# Patient Record
Sex: Male | Born: 1955 | Race: White | Hispanic: No | Smoking: Never smoker
Health system: Southern US, Community
[De-identification: ages and names within clinical notes are randomized; demographics above are authoritative.]

## PROBLEM LIST (undated history)

## (undated) DIAGNOSIS — N400 Enlarged prostate without lower urinary tract symptoms: Secondary | ICD-10-CM

## (undated) DIAGNOSIS — M1990 Unspecified osteoarthritis, unspecified site: Secondary | ICD-10-CM

## (undated) DIAGNOSIS — J40 Bronchitis, not specified as acute or chronic: Secondary | ICD-10-CM

## (undated) HISTORY — DX: Benign prostatic hyperplasia without lower urinary tract symptoms: N40.0

## (undated) HISTORY — PX: WRIST SURGERY: SHX841

## (undated) HISTORY — PX: HERNIA REPAIR: SHX51

## (undated) HISTORY — PX: CIRCUMCISION: SUR203

## (undated) HISTORY — PX: HYDROCELE EXCISION: SHX482

## (undated) HISTORY — PX: LEG SURGERY: SHX1003

---

## 2000-06-01 ENCOUNTER — Encounter: Payer: Self-pay | Admitting: Specialist

## 2000-06-01 ENCOUNTER — Encounter: Admission: RE | Admit: 2000-06-01 | Discharge: 2000-06-01 | Payer: Self-pay | Admitting: Specialist

## 2000-06-05 ENCOUNTER — Ambulatory Visit (HOSPITAL_BASED_OUTPATIENT_CLINIC_OR_DEPARTMENT_OTHER): Admission: RE | Admit: 2000-06-05 | Discharge: 2000-06-05 | Payer: Self-pay | Admitting: Specialist

## 2005-01-27 ENCOUNTER — Other Ambulatory Visit: Admission: RE | Admit: 2005-01-27 | Discharge: 2005-01-27 | Payer: Self-pay | Admitting: Unknown Physician Specialty

## 2005-06-04 ENCOUNTER — Emergency Department (HOSPITAL_COMMUNITY): Admission: EM | Admit: 2005-06-04 | Discharge: 2005-06-04 | Payer: Self-pay | Admitting: Emergency Medicine

## 2016-08-23 ENCOUNTER — Ambulatory Visit (INDEPENDENT_AMBULATORY_CARE_PROVIDER_SITE_OTHER): Payer: BLUE CROSS/BLUE SHIELD | Admitting: Urology

## 2016-08-23 DIAGNOSIS — R972 Elevated prostate specific antigen [PSA]: Secondary | ICD-10-CM

## 2016-08-23 DIAGNOSIS — N5201 Erectile dysfunction due to arterial insufficiency: Secondary | ICD-10-CM

## 2016-08-23 DIAGNOSIS — N401 Enlarged prostate with lower urinary tract symptoms: Secondary | ICD-10-CM

## 2016-08-23 DIAGNOSIS — N200 Calculus of kidney: Secondary | ICD-10-CM

## 2016-09-02 ENCOUNTER — Other Ambulatory Visit: Payer: Self-pay | Admitting: Urology

## 2016-09-02 DIAGNOSIS — R972 Elevated prostate specific antigen [PSA]: Secondary | ICD-10-CM

## 2016-10-04 ENCOUNTER — Ambulatory Visit (HOSPITAL_COMMUNITY)
Admission: RE | Admit: 2016-10-04 | Discharge: 2016-10-04 | Disposition: A | Discharge status: Home / Self Care | Payer: BLUE CROSS/BLUE SHIELD | Source: Ambulatory Visit | Attending: Urology | Admitting: Urology

## 2016-10-04 DIAGNOSIS — R972 Elevated prostate specific antigen [PSA]: Secondary | ICD-10-CM | POA: Diagnosis not present

## 2016-10-04 MED ORDER — GENTAMICIN SULFATE 40 MG/ML IJ SOLN
INTRAMUSCULAR | Status: AC
  Filled 2016-10-04: qty 4

## 2016-10-04 MED ORDER — GENTAMICIN SULFATE 40 MG/ML IJ SOLN
160.0000 mg | Freq: Once | INTRAMUSCULAR | Status: AC
  Administered 2016-10-04: 160 mg via INTRAMUSCULAR

## 2016-10-04 MED ORDER — LIDOCAINE HCL (PF) 2 % IJ SOLN
10.0000 mL | Freq: Once | INTRAMUSCULAR | Status: AC
  Administered 2016-10-04: 10 mL

## 2016-10-04 MED ORDER — LIDOCAINE HCL (PF) 2 % IJ SOLN
INTRAMUSCULAR | Status: AC
  Administered 2016-10-04: 10 mL
  Filled 2016-10-04: qty 10

## 2016-10-04 NOTE — Discharge Instructions (Signed)
Transrectal Ultrasound-Guided Biopsy °A transrectal ultrasound-guided biopsy is a procedure to remove samples of tissue from your prostate using ultrasound images to guide the procedure. The procedure is usually done to evaluate the prostate gland of men who have an elevated prostate-specific antigen (PSA). PSA is a blood test to screen for prostate cancer. The biopsy samples are taken to check for prostate cancer. °Tell a health care provider about: °· Any allergies you have. °· All medicines you are taking, including vitamins, herbs, eye drops, creams, and over-the-counter medicines. °· Any problems you or family members have had with anesthetic medicines. °· Any blood disorders you have. °· Any surgeries you have had. °· Any medical conditions you have. °What are the risks? °Generally, this is a safe procedure. However, as with any procedure, problems can occur. Possible problems include: °· Infection of your prostate. °· Bleeding from your rectum or blood in your urine. °· Difficulty urinating. °· Nerve damage (this is usually temporary). °· Damage to surrounding structures such as blood vessels, organs, and muscles, which would require other procedures. °What happens before the procedure? °· Do not eat or drink anything after midnight on the night before the procedure or as directed by your health care provider. °· Take medicines only as directed by your health care provider. °· Your health care provider may have you stop taking certain medicines 5-7 days before the procedure. °· You will be given an enema before the procedure. During an enema, a liquid is injected into your rectum to clear out waste. °· You may have lab tests the day of your procedure. °· Plan to have someone take you home after the procedure. °What happens during the procedure? °· You will be given medicine to help you relax (sedative) before the procedure. An IV tube will be inserted into one of your veins and used to give fluids and  medicine. °· You will be given antibiotic medicine to reduce the risk of an infection. °· You will be placed on your side for the procedure. °· A probe with lubricated gel will be placed into your rectum, and images will be taken of your prostate and surrounding structures. °· Numbing medicine will be injected into the prostate before the biopsy samples are taken. °· A biopsy needle will then be inserted and guided to your prostate with the use of the ultrasound images. °· Samples of prostate tissue will be taken, and the needle will then be removed. °· The biopsy samples will be sent to a lab to be analyzed. Results are usually back in 2-3 days. °What happens after the procedure? °· You will be taken to a recovery area where you will be monitored. °· You may have some discomfort in the rectal area. You will be given pain medicines to control this. °· You may be allowed to go home the same day, or you may need to stay in the hospital overnight. °This information is not intended to replace advice given to you by your health care provider. Make sure you discuss any questions you have with your health care provider. °Document Released: 11/25/2013 Document Revised: 12/17/2015 Document Reviewed: 02/27/2013 °Elsevier Interactive Patient Education © 2017 [employer] ° °

## 2017-02-21 ENCOUNTER — Ambulatory Visit: Payer: BLUE CROSS/BLUE SHIELD | Admitting: Urology

## 2018-08-28 ENCOUNTER — Other Ambulatory Visit (HOSPITAL_COMMUNITY)
Admission: RE | Admit: 2018-08-28 | Discharge: 2018-08-28 | Disposition: A | Discharge status: Home / Self Care | Payer: BLUE CROSS/BLUE SHIELD | Source: Ambulatory Visit | Attending: Urology | Admitting: Urology

## 2018-08-28 ENCOUNTER — Ambulatory Visit (INDEPENDENT_AMBULATORY_CARE_PROVIDER_SITE_OTHER): Payer: BLUE CROSS/BLUE SHIELD | Admitting: Urology

## 2018-08-28 DIAGNOSIS — R350 Frequency of micturition: Secondary | ICD-10-CM

## 2018-08-28 DIAGNOSIS — R972 Elevated prostate specific antigen [PSA]: Secondary | ICD-10-CM | POA: Diagnosis not present

## 2018-08-28 DIAGNOSIS — N401 Enlarged prostate with lower urinary tract symptoms: Secondary | ICD-10-CM | POA: Insufficient documentation

## 2018-08-30 LAB — URINE CULTURE: Culture: NO GROWTH

## 2018-10-30 ENCOUNTER — Ambulatory Visit (INDEPENDENT_AMBULATORY_CARE_PROVIDER_SITE_OTHER): Payer: BLUE CROSS/BLUE SHIELD | Admitting: Urology

## 2018-10-30 DIAGNOSIS — R350 Frequency of micturition: Secondary | ICD-10-CM | POA: Diagnosis not present

## 2018-10-30 DIAGNOSIS — R972 Elevated prostate specific antigen [PSA]: Secondary | ICD-10-CM | POA: Diagnosis not present

## 2018-10-30 DIAGNOSIS — N401 Enlarged prostate with lower urinary tract symptoms: Secondary | ICD-10-CM | POA: Diagnosis not present

## 2019-07-24 ENCOUNTER — Telehealth: Payer: Self-pay | Admitting: Urology

## 2019-07-24 NOTE — Telephone Encounter (Signed)
Pt called, wanted to know if he could get a refill of tamsulosin.

## 2019-07-24 NOTE — Telephone Encounter (Signed)
Called pt.no answer. No way to leave message. Rx was sent on 12/29 via urochart from [facility].

## 2019-07-25 NOTE — Telephone Encounter (Signed)
Did you find out of the rx was at the pharmacy for the patient when he called back yesterday? I called pt again and no answer.

## 2019-10-24 ENCOUNTER — Telehealth: Payer: Self-pay | Admitting: Urology

## 2019-10-24 NOTE — Telephone Encounter (Signed)
Pt called and asked for a refill on his tamsulosin.

## 2019-10-28 ENCOUNTER — Other Ambulatory Visit: Payer: Self-pay

## 2019-10-28 DIAGNOSIS — N401 Enlarged prostate with lower urinary tract symptoms: Secondary | ICD-10-CM

## 2019-10-28 MED ORDER — TAMSULOSIN HCL 0.4 MG PO CAPS: 0.4000 mg | ORAL_CAPSULE | Freq: Every day | ORAL | 0 refills | Status: DC

## 2019-10-28 NOTE — Telephone Encounter (Signed)
Rx Sent  

## 2019-11-19 ENCOUNTER — Encounter: Payer: Self-pay | Admitting: Urology

## 2019-11-19 ENCOUNTER — Ambulatory Visit (INDEPENDENT_AMBULATORY_CARE_PROVIDER_SITE_OTHER): Payer: BC Managed Care – PPO | Admitting: Urology

## 2019-11-19 ENCOUNTER — Other Ambulatory Visit: Payer: Self-pay | Admitting: Urology

## 2019-11-19 ENCOUNTER — Other Ambulatory Visit: Payer: Self-pay

## 2019-11-19 VITALS — BP 130/85 | HR 85 | Temp 98.7°F | Ht 68.0 in | Wt 150.0 lb

## 2019-11-19 DIAGNOSIS — N401 Enlarged prostate with lower urinary tract symptoms: Secondary | ICD-10-CM | POA: Diagnosis not present

## 2019-11-19 DIAGNOSIS — N521 Erectile dysfunction due to diseases classified elsewhere: Secondary | ICD-10-CM

## 2019-11-19 LAB — POCT URINALYSIS DIPSTICK
Bilirubin, UA: NEGATIVE
Glucose, UA: NEGATIVE
Ketones, UA: NEGATIVE
Nitrite, UA: NEGATIVE
Protein, UA: NEGATIVE
Spec Grav, UA: 1.025 (ref 1.010–1.025)
Urobilinogen, UA: NEGATIVE E.U./dL — AB
pH, UA: 6 (ref 5.0–8.0)

## 2019-11-19 MED ORDER — SILDENAFIL CITRATE 100 MG PO TABS: 100.0000 mg | ORAL_TABLET | Freq: Every day | ORAL | 11 refills | Status: DC | PRN

## 2019-11-19 MED ORDER — TAMSULOSIN HCL 0.4 MG PO CAPS: 0.4000 mg | ORAL_CAPSULE | Freq: Every day | ORAL | 11 refills | Status: DC

## 2019-11-19 NOTE — Progress Notes (Signed)
H&P  Chief Complaint: Elevated PSA and BPH  History of Present Illness:   4.27.2021: Here today for follow-up. No recent PSA. Of note, he had a 2 night hospitalization after a complicated, aborted colonoscopy for acute pulmonary edema . He reports having had good days and bad days with his intermittent urinary sx's. He first made this appointment over concerns w/ lower pelvic pain he first thought could be related to his prostate but now suspects this is just related to his colon. He continues on tamsulosin.    He also expresses concerns over recently getting involved with an online doctor based out of [place] who prescribed him viagra -- he is trying to cease contact with him and urged [place] to not coordinate with him ("Dr Jeralene Peters").  IPSS Questionnaire (AUA-7): Over the past month.   1)  How often have you had a sensation of not emptying your bladder completely after you finish urinating?  1 - Less than 1 time in 5  2)  How often have you had to urinate again less than two hours after you finished urinating? 2 - Less than half the time  3)  How often have you found you stopped and started again several times when you urinated?  0 - Not at all  4) How difficult have you found it to postpone urination?  5 - Almost always  5) How often have you had a weak urinary stream?  0 - Not at all  6) How often have you had to push or strain to begin urination?  0 - Not at all  7) How many times did you most typically get up to urinate from the time you went to bed until the time you got up in the morning?  3 - 3 times  Total score:  0-7 mildly symptomatic   8-19 moderately symptomatic   20-35 severely symptomatic   Total: 11 QoL: 3  (below copied from [facility] records):  PSA: Eric Andrade is a 64 year-old male established patient who is here for follow up for further evaluation of his elevated PSA.  His last PSA was performed 08/23/2016. The last PSA value was 8.2.   Here for followup of elevated PSA. He  had a previous ultrasound and biopsy by Dr. Exie Parody in [place] 8.20.2015 At that time, PSA was 5.3.   Repeat TRUS/Bx 3.13.2018. Prostatic volume 59 ml PSA 8.2 PSAD 0.14. All 12 cores benign.   2.4.2020: He has not returned since that biopsy. Overall, he is voiding well the comes in today because of dysuria and slower stream.   4.7.2020: He is here today for follow-up with voiding symptoms. He was put on tamsulosin with excellent response symptom wise. He has not had PSA recheck.   BPH:  I have symptoms of an enlarged prostate.  2.4.2020: Urinary symptoms are worse recently. He has had recent dysuria, frequency, urgency and feeling of incomplete emptying.   IPSS 33  Quality of Life score 6  He was started on tamsulosin   4.7.2020: Symptoms are better on tamsulosin. IPSS improved.    No past medical history on file.   Home Medications:  Allergies as of 11/19/2019   No Known Allergies     Medication List       Accurate as of November 19, 2019 11:31 AM. If you have any questions, ask your nurse or doctor.        tamsulosin 0.4 MG Caps capsule Commonly known as: FLOMAX What changed: Another medication with  the same name was added. Make sure you understand how and when to take each. Changed by: Jorja Loa, MD   tamsulosin 0.4 MG Caps capsule Commonly known as: FLOMAX Take 1 capsule (0.4 mg total) by mouth daily. What changed: Another medication with the same name was added. Make sure you understand how and when to take each. Changed by: Jorja Loa, MD   tamsulosin 0.4 MG Caps capsule Commonly known as: FLOMAX Take 1 capsule (0.4 mg total) by mouth daily after supper. What changed: You were already taking a medication with the same name, and this prescription was added. Make sure you understand how and when to take each. Changed by: Jorja Loa, MD       Allergies: No Known Allergies  No family history on file.  Social History:  reports that he has  never smoked. He has never used smokeless tobacco. No history on file for alcohol and drug.  ROS: Urological Symptom Review  Patient is experiencing the following symptoms: Hard to postpone urination Get up at night to urinate Leakage of urine Erection problems (male only)   Review of Systems  Gastrointestinal (upper)  : Indigestion/heartburn Gastrointestinal (lower) : Negative for lower GI symptoms Constitutional : Negative for symptoms Skin: Negative for skin symptoms Eyes: Negative for eye symptoms Ear/Nose/Throat : Negative for Ear/Nose/Throat symptoms Hematologic/Lymphatic: Negative for Hematologic/Lymphatic symptoms Cardiovascular : Negative for cardiovascular symptoms Respiratory : Negative for respiratory symptoms Endocrine: Negative for endocrine symptoms Musculoskeletal: Negative for musculoskeletal symptoms Neurological: Negative for neurological symptoms Psychologic: Negative for psychiatric symptoms  Physical Exam:  Vital signs in last 24 hours: BP 130/85   Pulse 85   Temp 98.7 F (37.1 C)   Ht 5\' 8"  (1.727 m)   Wt 150 lb (68 kg)   BMI 22.81 kg/m  Constitutional:  Alert and oriented, No acute distress Cardiovascular: Regular rate  Respiratory: Normal respiratory effort GI: Abdomen is soft, nontender, nondistended, no abdominal masses. No CVAT. No hernias. Genitourinary: Normal male phallus, testes are descended bilaterally and non-tender and without masses, scrotum is normal in appearance without lesions or masses, perineum is normal on inspection. Prostate feels around 100 grams in size, R lobe > L. Lymphatic: No lymphadenopathy Neurologic: Grossly intact, no focal deficits Psychiatric: Normal mood and affect  Laboratory Data:  No results for input(s): WBC, HGB, HCT, PLT in the last 72 hours.  No results for input(s): NA, K, CL, GLUCOSE, BUN, CALCIUM, CREATININE in the last 72 hours.  Invalid input(s): CO3   Results for orders placed  or performed in visit on 11/19/19 (from the past 24 hour(s))  POCT urinalysis dipstick     Status: Abnormal   Collection Time: 11/19/19 11:18 AM  Result Value Ref Range   Color, UA yellow    Clarity, UA clear    Glucose, UA Negative Negative   Bilirubin, UA neg    Ketones, UA neg    Spec Grav, UA 1.025 1.010 - 1.025   Blood, UA trace    pH, UA 6.0 5.0 - 8.0   Protein, UA Negative Negative   Urobilinogen, UA negative (A) 0.2 or 1.0 E.U./dL   Nitrite, UA neg    Leukocytes, UA Moderate (2+) (A) Negative   Appearance clear    Odor     I have reviewed prior pt notes  I have reviewed notes from referring/previous physicians  I have reviewed urinalysis results  I have reviewed prior PSA results   Impression/Assessment:  He seems to  be doing well on tamsulosin with regards to his urinary sx's w/ minimal side effects. He is fine to continue on this. DRE normal. We will get a PSA today. I am fine managing his sildenafil rx (start on 100 mg since he was taking 4-5 20 mg at a time) .  His lower pelvic/abdominal pain more than likely related to this colon issues he has been having that are being evaluated per his other doctors   Plan:  1. PSA today -- will forward results  2. Rx given for 100 mg sildenafil , flomax refilled  3. Return for OV in 1 yr w/ PSA

## 2019-11-19 NOTE — Progress Notes (Signed)
See H& P.

## 2019-11-20 ENCOUNTER — Telehealth: Payer: Self-pay | Admitting: Urology

## 2019-11-20 ENCOUNTER — Other Ambulatory Visit: Payer: Self-pay | Admitting: Urology

## 2019-11-20 DIAGNOSIS — R972 Elevated prostate specific antigen [PSA]: Secondary | ICD-10-CM

## 2019-11-20 LAB — PSA: PSA: 32.8 ng/mL — ABNORMAL HIGH (ref <=4.0)

## 2019-11-20 NOTE — Telephone Encounter (Signed)
Carly from [facility] called and stated she had questions about the pt rx.

## 2019-11-21 NOTE — Telephone Encounter (Signed)
Called pt in regard to [facility] message. Pt. states that he does not want to use [facility] for anymore of his medication needs and that he is satisfied with where  Dr. Diona Fanti sent his rx.

## 2019-11-25 ENCOUNTER — Telehealth: Payer: Self-pay

## 2019-11-25 NOTE — Telephone Encounter (Signed)
-----   Message from Dorisann Frames, RN sent at 11/25/2019 10:57 AM EDT -----  ----- Message ----- From: Franchot Gallo, MD Sent: 11/20/2019  12:57 PM EDT To: Dorisann Frames, RN  Notify patient -PSA up to 32.8.  I would suggest we perform an MRI of his prostate to check to see if he has any suspicious looking areas that would need to be biopsy.  I put order in. ----- Message ----- From: Dorisann Frames, RN Sent: 11/20/2019  12:01 PM EDT To: Franchot Gallo, MD  Please review

## 2019-11-25 NOTE — Telephone Encounter (Signed)
Left message for pt to return call.

## 2019-11-26 NOTE — Telephone Encounter (Signed)
Left message for pt to return call.

## 2019-11-26 NOTE — Telephone Encounter (Signed)
Pt returned call and made aware 

## 2019-11-28 DIAGNOSIS — D120 Benign neoplasm of cecum: Secondary | ICD-10-CM | POA: Insufficient documentation

## 2019-11-28 DIAGNOSIS — Z800 Family history of malignant neoplasm of digestive organs: Secondary | ICD-10-CM | POA: Insufficient documentation

## 2019-11-28 DIAGNOSIS — D124 Benign neoplasm of descending colon: Secondary | ICD-10-CM | POA: Insufficient documentation

## 2019-12-18 ENCOUNTER — Other Ambulatory Visit: Payer: Self-pay

## 2019-12-18 ENCOUNTER — Ambulatory Visit (HOSPITAL_COMMUNITY)
Admission: RE | Admit: 2019-12-18 | Discharge: 2019-12-18 | Disposition: A | Discharge status: Home / Self Care | Payer: BC Managed Care – PPO | Source: Ambulatory Visit | Attending: Urology | Admitting: Urology

## 2019-12-18 DIAGNOSIS — R972 Elevated prostate specific antigen [PSA]: Secondary | ICD-10-CM | POA: Insufficient documentation

## 2019-12-18 MED ORDER — GADOBUTROL 1 MMOL/ML IV SOLN
6.0000 mL | Freq: Once | INTRAVENOUS | Status: AC | PRN
  Administered 2019-12-18: 6 mL via INTRAVENOUS

## 2019-12-31 ENCOUNTER — Other Ambulatory Visit: Payer: Self-pay | Admitting: Urology

## 2019-12-31 DIAGNOSIS — R972 Elevated prostate specific antigen [PSA]: Secondary | ICD-10-CM

## 2019-12-31 NOTE — Progress Notes (Signed)
Attempted to call pt on both numbers listed. No VM and box full.

## 2020-01-01 ENCOUNTER — Telehealth: Payer: Self-pay

## 2020-01-01 NOTE — Telephone Encounter (Signed)
Pt. Was notified MRI was results were good and that a 6 month appt was made with PSA to be done week before. Dr. Diona Fanti ordered this through Staff message.

## 2020-01-06 ENCOUNTER — Telehealth: Payer: Self-pay

## 2020-01-06 ENCOUNTER — Other Ambulatory Visit: Payer: Self-pay

## 2020-01-06 NOTE — Telephone Encounter (Signed)
Pt. Notified. Faxed order for PSA to [facility]. Mail ed copy to pt. Also.

## 2020-01-06 NOTE — Telephone Encounter (Signed)
-----   Message from Franchot Gallo, MD sent at 12/31/2019  9:11 AM EDT ----- Notify pt--good news MRI did not show evidence of significant PCa, but I would like him to have repeat PSA in 1 mo--I put order in . ----- Message ----- From: Dorisann Frames, RN Sent: 12/18/2019   3:11 PM EDT To: Franchot Gallo, MD  Please review

## 2020-03-06 ENCOUNTER — Encounter (INDEPENDENT_AMBULATORY_CARE_PROVIDER_SITE_OTHER): Payer: Self-pay | Admitting: *Deleted

## 2020-06-24 IMAGING — MR MR PROSTATE WO/W CM
12 series · 48 of 48 positions shown · IV contrast (gadavist)
Comparison: 08/03/2017 abdominopelvic CT.

CLINICAL DATA: Elevated PSA.  Prior negative biopsy in 0713.

EXAM:
MR PROSTATE WITHOUT AND WITH CONTRAST
TECHNIQUE: Multiplanar multisequence MRI images were obtained of the pelvis
centered about the prostate. Pre and post contrast images were
obtained.
CONTRAST:  6mL GADAVIST GADOBUTROL 1 MMOL/ML IV SOLN

[Series 3: T1 · axial · 6.0mm · 0.86mm/px · 1 of 43 slices shown (1 of 2)]
[im 1/43]
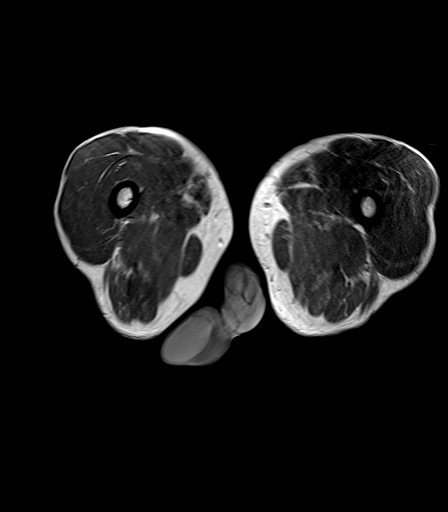

[Series 4: axial whole pelvis · axial · 6.0mm · 0.84mm/px · 1 of 47 slices shown]
[im 1/47]
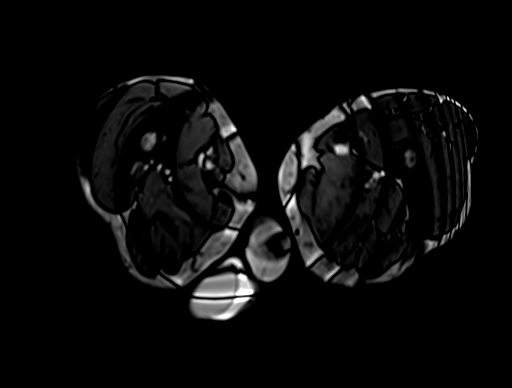

[Series 5: T2 · axial · 3.0mm · 0.47mm/px · 1 of 30 slices shown (1 of 4)]
[im 1/30]
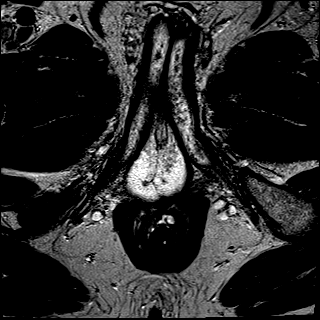

[Series 6: T2 · coronal · 4.0mm · 0.47mm/px · 1 of 24 slices shown (2 of 4)]
[im 1/24]
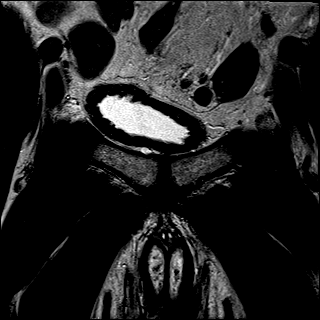

[Series 7: T2 · sagittal · 3.5mm · 0.47mm/px · 1 of 26 slices shown (3 of 4)]
[im 1/26]
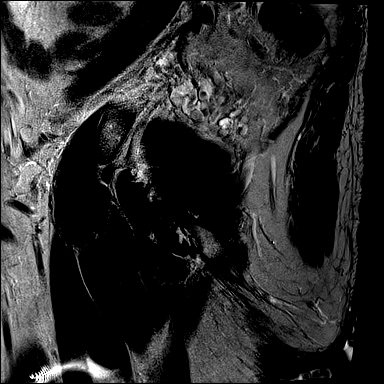

[Series 8: T1 · axial · 3.0mm · 0.50mm/px · 1 of 30 slices shown (2 of 2)]
[im 1/30]
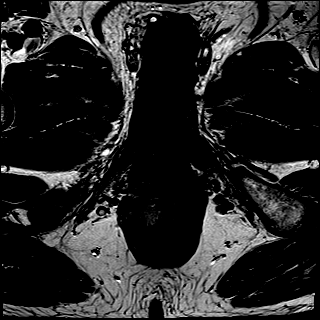

[Series 9: T2 · axial · 1.2mm · 1.00mm/px · z∈[-75,+20]mm · 2 of 80 slices shown (4 of 4)]
[im 1/80]
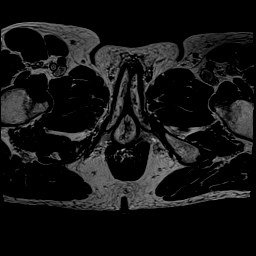
[im 80/80]
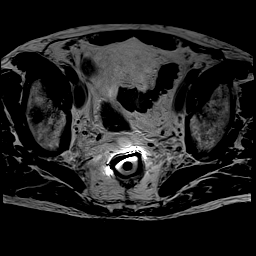

[Series 10: ep2d_diff_b50_400_800_tra_endo**_tracew_dfc_mix · axial · 3.0mm · 1.60mm/px · z∈[-83,+18]mm · 2 of 72 slices shown]
[im 1/72]
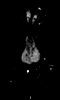
[im 72/72]
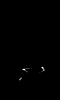

[Series 11: ep2d_diff_b50_400_800_tra_endo**_adc_dfc_mix · axial · 3.0mm · 1.60mm/px · 1 of 30 slices shown]
[im 1/30]
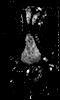

[Series 12: ep2d_diff_b50_400_800_tra_endo**_calc_bval_dfc_mix · axial · 3.0mm · 1.60mm/px · 1 of 30 slices shown]
[im 1/30]
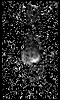

[Series 13: t1_vibe_tra_dyn · axial · 3.0mm · 0.98mm/px · z∈[-79,+13]mm · 19 of 640 slices shown]
[im 1/640]
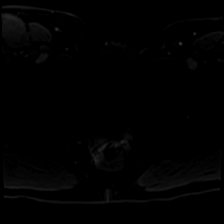
[im 36/640]
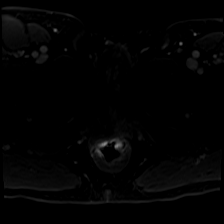
[im 72/640]
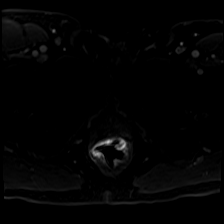
[im 107/640]
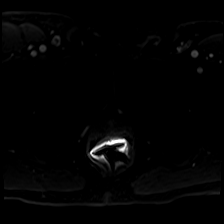
[im 143/640]
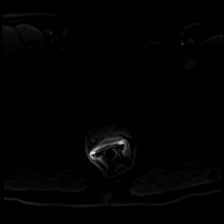
[im 178/640]
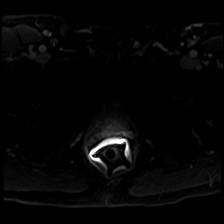
[im 214/640]
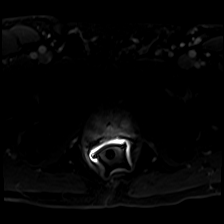
[im 249/640]
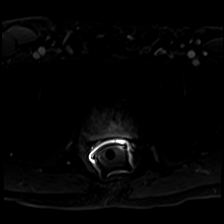
[im 285/640]
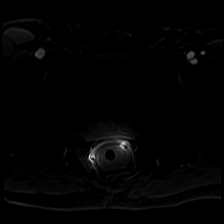
[im 320/640]
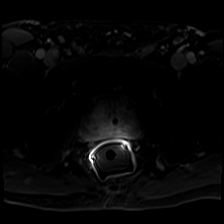
[im 356/640]
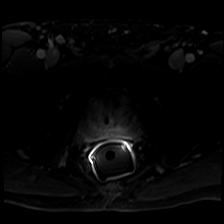
[im 391/640]
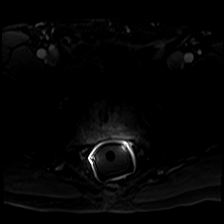
[im 427/640]
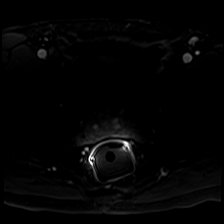
[im 462/640]
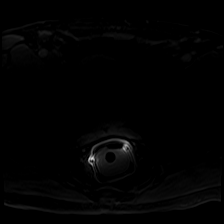
[im 498/640]
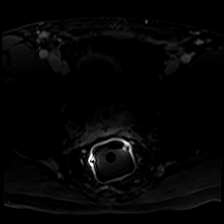
[im 533/640]
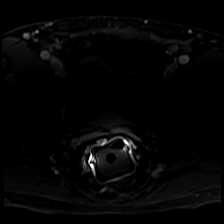
[im 569/640]
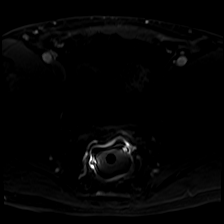
[im 604/640]
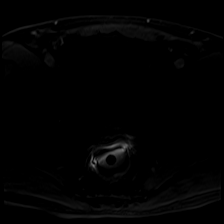
[im 640/640]
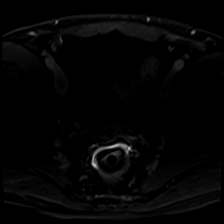

[Series 14: t1_vibe_tra_dyn_sub · axial · 3.0mm · 0.98mm/px · z∈[-79,+13]mm · 17 of 585 slices shown]
[im 1/585]
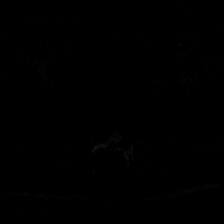
[im 37/585]
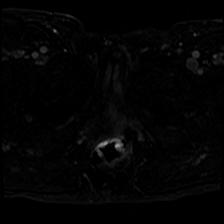
[im 74/585]
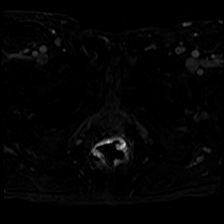
[im 110/585]
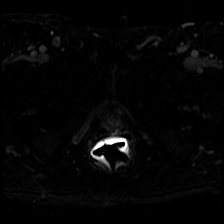
[im 147/585]
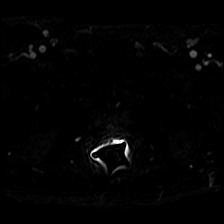
[im 183/585]
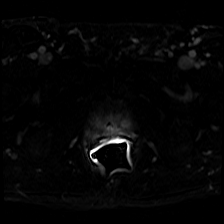
[im 220/585]
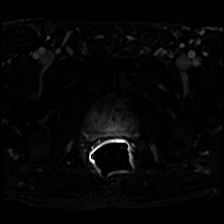
[im 256/585]
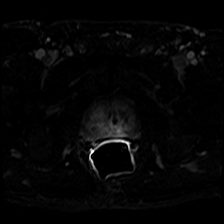
[im 293/585]
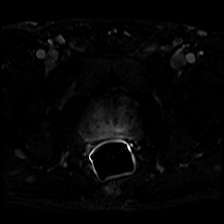
[im 329/585]
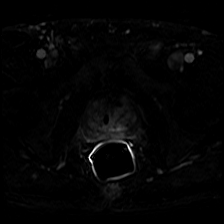
[im 366/585]
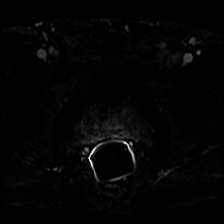
[im 402/585]
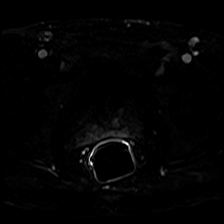
[im 439/585]
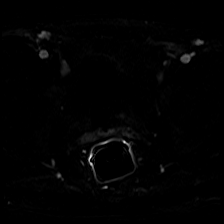
[im 475/585]
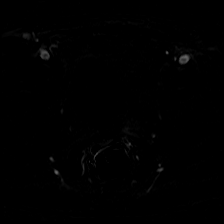
[im 512/585]
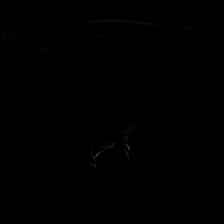
[im 548/585]
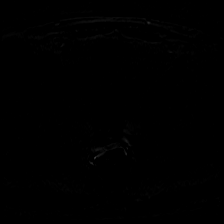
[im 585/585]
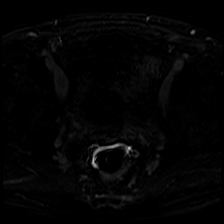

[48 of 48 positions shown; findings below may reference images not displayed]

FINDINGS: Prostate: Moderate central gland enlargement and heterogeneity,
consistent with benign prostatic hyperplasia. No dominant central
gland nodule. Mild to moderate peripheral zone compression. No areas
of masslike T2 hypointensity, restricted diffusion, or early
post-contrast enhancement

Volume: 5.9 x 4.1 x 6.1 cm (volume = 77 cm^3) .

Transcapsular spread:  Absent

Seminal vesicle involvement: Absent

Neurovascular bundle involvement: Absent

Pelvic adenopathy: Absent

Bone metastasis: Absent

Other findings: No significant free fluid.  Normal urinary bladder.
IMPRESSION: No evidence of macroscopic or high-grade prostate carcinoma.
Moderate benign prostatic hyperplasia.

## 2020-07-07 ENCOUNTER — Ambulatory Visit: Payer: BC Managed Care – PPO | Admitting: Urology

## 2020-07-07 NOTE — Progress Notes (Incomplete)
H&P  Chief Complaint: BPH & Elevated PSA  History of Present Illness:   12.14.2021:   (below copied from [facility] records):  PSA: Eric Andrade is a 64 year-old male established patient who is here for follow up for further evaluation of his elevated PSA.  His last PSA was performed 08/23/2016. The last PSA value was 8.2.   Here for followup of elevated PSA. He had a previous ultrasound and biopsy by Dr. Exie Parody in [place] 8.20.2015 At that time, PSA was 5.3.   Repeat TRUS/Bx 3.13.2018. Prostatic volume 59 ml PSA 8.2 PSAD 0.14. All 12 cores benign.   2.4.2020: He has not returned since that biopsy. Overall, he is voiding well the comes in today because of dysuria and slower stream.   4.7.2020: He is here today for follow-up with voiding symptoms. He was put on tamsulosin with excellent response symptom wise. He has not had PSA recheck.   BPH:  I have symptoms of an enlarged prostate.  2.4.2020: Urinary symptoms are worse recently. He has had recent dysuria, frequency, urgency and feeling of incomplete emptying.   IPSS 33  Quality of Life score 6  He was started on tamsulosin   4.7.2020: Symptoms are better on tamsulosin. IPSS improved.     4.27.2021: Here today for follow-up. No recent PSA. Of note, he had a 2 night hospitalization after a complicated, aborted colonoscopy for acute pulmonary edema . He reports having had good days and bad days with his intermittent urinary sx's. He first made this appointment over concerns w/ lower pelvic pain he first thought could be related to his prostate but now suspects this is just related to his colon. He continues on tamsulosin.    He also expresses concerns over recently getting involved with an online doctor based out of [place] who prescribed him viagra -- he is trying to cease contact with him and urged [place] to not coordinate with him ("Dr Jeralene Peters").  Home Medications:  Allergies as of 07/07/2020   No Known Allergies      Medication List       Accurate as of July 07, 2020  9:21 AM. If you have any questions, ask your nurse or doctor.        sildenafil 100 MG tablet Commonly known as: VIAGRA Take 1 tablet (100 mg total) by mouth daily as needed for erectile dysfunction.   tamsulosin 0.4 MG Caps capsule Commonly known as: FLOMAX   tamsulosin 0.4 MG Caps capsule Commonly known as: FLOMAX Take 1 capsule (0.4 mg total) by mouth daily.   tamsulosin 0.4 MG Caps capsule Commonly known as: FLOMAX Take 1 capsule (0.4 mg total) by mouth daily after supper.       Allergies: No Known Allergies  No family history on file.  Social History:  reports that he has never smoked. He has never used smokeless tobacco. No history on file for alcohol use and drug use.  ROS: A complete review of systems was performed.  All systems are negative except for pertinent findings as noted.  Physical Exam:  Vital signs in last 24 hours: There were no vitals taken for this visit. Constitutional:  Alert and oriented, No acute distress Cardiovascular: Regular rate  Respiratory: Normal respiratory effort GI: Abdomen is soft, nontender, nondistended, no abdominal masses. No CVAT.  Genitourinary: Normal male phallus, testes are descended bilaterally and non-tender and without masses, scrotum is normal in appearance without lesions or masses, perineum is normal on inspection. Lymphatic: No lymphadenopathy Neurologic: Grossly  intact, no focal deficits Psychiatric: Normal mood and affect  Laboratory Data:  No results for input(s): WBC, HGB, HCT, PLT in the last 72 hours.  No results for input(s): NA, K, CL, GLUCOSE, BUN, CALCIUM, CREATININE in the last 72 hours.  Invalid input(s): CO3   No results found for this or any previous visit (from the past 24 hour(s)). No results found for this or any previous visit (from the past 240 hour(s)).  Renal Function: No results for input(s): CREATININE in the last 168  hours. CrCl cannot be calculated (No successful lab value found.).  Radiologic Imaging: No results found.  Impression/Assessment:  ***  Plan:  ***

## 2020-11-16 NOTE — Progress Notes (Signed)
History of Present Illness:   Here for followup of elevated PSA. He had a previous TRUS/Bx  by Dr. Exie Parody in [place] 8.20.2015 At that time, PSA was 5.3.   Repeat TRUS/Bx 3.13.2018. Prostatic volume 59 ml PSA 8.2 PSAD 0.14. All 12 cores benign.   2.4.2020: He has not returned since that biopsy. Overall, he is voiding well the comes in today because of dysuria and slower stream.   4.26.2022: For his annual check.  His PSA at his visit a year ago was 32.8.  Apparently he had a recheck and it was down to 10 at his PCPs office and evening.  BPH:   No significant urinary issues that he is complaining about.  He is on tamsulosin.  ED: He does have ED, he would like a prescription for Viagra.   No past medical history on file.  No past surgical history on file.  Home Medications:  Allergies as of 11/17/2020   No Known Allergies     Medication List       Accurate as of November 16, 2020  8:13 PM. If you have any questions, ask your nurse or doctor.        sildenafil 100 MG tablet Commonly known as: VIAGRA Take 1 tablet (100 mg total) by mouth daily as needed for erectile dysfunction.   tamsulosin 0.4 MG Caps capsule Commonly known as: FLOMAX   tamsulosin 0.4 MG Caps capsule Commonly known as: FLOMAX Take 1 capsule (0.4 mg total) by mouth daily.   tamsulosin 0.4 MG Caps capsule Commonly known as: FLOMAX Take 1 capsule (0.4 mg total) by mouth daily after supper.       Allergies: No Known Allergies  No family history on file.  Social History:  reports that he has never smoked. He has never used smokeless tobacco. No history on file for alcohol use and drug use.  ROS: A complete review of systems was performed.  All systems are negative except for pertinent findings as noted.  Physical Exam:  Vital signs in last 24 hours: There were no vitals taken for this visit. Constitutional:  Alert and oriented, No acute distress Cardiovascular: Regular rate  Respiratory: Normal  respiratory effort Genitourinary: Normal male phallus, testes are descended bilaterally and non-tender and without masses, scrotum is normal in appearance without lesions or masses, perineum is normal on inspection.  Prostate 100 g, symmetrical, nonnodular and nontender. Lymphatic: No lymphadenopathy Neurologic: Grossly intact, no focal deficits Psychiatric: Normal mood and affect  I have reviewed prior pt notes  I have reviewed notes from referring/previous physicians  I have reviewed urinalysis results  I have reviewed prior PSA results      Impression/Assessment:  1.  Elevated PSA with negative biopsy.  PSA significantly higher last year but apparently was most recently 10.  Stable DRE  2.  BPH, on tamsulosin, voiding well  3.  ED, on Viagra  Plan:  1.  Prescription for Viagra sent in  2.  Continue tamsulosin  3.  I will get his most recent PSA results from Dr. Manuella Ghazi  4.  If that stable, I will see back annually

## 2020-11-17 ENCOUNTER — Encounter: Payer: Self-pay | Admitting: Urology

## 2020-11-17 ENCOUNTER — Ambulatory Visit (INDEPENDENT_AMBULATORY_CARE_PROVIDER_SITE_OTHER): Payer: BC Managed Care – PPO | Admitting: Urology

## 2020-11-17 ENCOUNTER — Other Ambulatory Visit: Payer: Self-pay

## 2020-11-17 VITALS — BP 101/66 | HR 80 | Temp 97.9°F | Wt 165.0 lb

## 2020-11-17 DIAGNOSIS — R972 Elevated prostate specific antigen [PSA]: Secondary | ICD-10-CM | POA: Diagnosis not present

## 2020-11-17 DIAGNOSIS — N5201 Erectile dysfunction due to arterial insufficiency: Secondary | ICD-10-CM | POA: Diagnosis not present

## 2020-11-17 LAB — URINALYSIS, ROUTINE W REFLEX MICROSCOPIC
Bilirubin, UA: NEGATIVE
Glucose, UA: NEGATIVE
Ketones, UA: NEGATIVE
Leukocytes,UA: NEGATIVE
Nitrite, UA: NEGATIVE
Protein,UA: NEGATIVE
Specific Gravity, UA: 1.025 (ref 1.005–1.030)
Urobilinogen, Ur: 0.2 mg/dL (ref 0.2–1.0)
pH, UA: 6 (ref 5.0–7.5)

## 2020-11-17 MED ORDER — SILDENAFIL CITRATE 100 MG PO TABS: 100.0000 mg | ORAL_TABLET | Freq: Every day | ORAL | 99 refills | Status: DC | PRN

## 2020-11-17 NOTE — Progress Notes (Signed)
Urological Symptom Review  Patient is experiencing the following symptoms: Frequent urination Hard to postpone urination Leakage of urine   Review of Systems  Gastrointestinal (upper)  : Negative for upper GI symptoms  Gastrointestinal (lower) : Negative for lower GI symptoms  Constitutional : Negative for symptoms  Skin: Negative for skin symptoms  Eyes: Negative for eye symptoms  Ear/Nose/Throat : Negative for Ear/Nose/Throat symptoms  Hematologic/Lymphatic: Negative for Hematologic/Lymphatic symptoms  Cardiovascular : Negative for cardiovascular symptoms  Respiratory : Negative for respiratory symptoms  Endocrine: Negative for endocrine symptoms  Musculoskeletal: Negative for musculoskeletal symptoms  Neurological: Negative for neurological symptoms  Psychologic: Negative for psychiatric symptoms

## 2020-11-19 ENCOUNTER — Other Ambulatory Visit: Payer: Self-pay

## 2020-12-04 ENCOUNTER — Other Ambulatory Visit: Payer: Self-pay

## 2020-12-04 DIAGNOSIS — N401 Enlarged prostate with lower urinary tract symptoms: Secondary | ICD-10-CM

## 2020-12-04 MED ORDER — TAMSULOSIN HCL 0.4 MG PO CAPS: 0.4000 mg | ORAL_CAPSULE | Freq: Every day | ORAL | 11 refills | Status: DC

## 2021-10-25 NOTE — Progress Notes (Signed)
History of Present Illness:  ? ?Here for followup of elevated PSA. He previously underwent  TRUS/Bx  by Dr. Exie Parody in [place] 8.20.2015 At that time, PSA was 5.3. Apparenlty all cores were benign. ?  ?Repeat TRUS/Bx 3.13.2018. Prostatic volume 59 ml PSA 8.2 PSAD 0.14. All 12 cores benign.  ?  ?4.26.2022:  His PSA at his visit last year was 32.8.  Apparently he had a recheck and it was down to 10 at Dr. Trena Platt office ? ?4.4.2023: He says his most recent PSA is about 10.  I do not have any old records. ?  ?BPH:  ?  ?He does complain about urinary symptomatology-urgency, frequency.  No significant nocturia.  Feels like he is not emptying well.  No gross hematuria or dysuria.  IPSS 2 5, quality-of-life score 6. ?  ?ED: He does have ED, he would like a prescription for Viagra. ? ?No past medical history on file. ? ?No past surgical history on file. ? ?Home Medications:  ?Allergies as of 10/26/2021   ?No Known Allergies ?  ? ?  ?Medication List  ?  ? ?  ? Accurate as of October 25, 2021  8:18 PM. If you have any questions, ask your nurse or doctor.  ?  ?  ? ?  ? ?sildenafil 100 MG tablet ?Commonly known as: VIAGRA ?Take 1 tablet (100 mg total) by mouth daily as needed for erectile dysfunction. ?  ?sildenafil 100 MG tablet ?Commonly known as: VIAGRA ?Take 1 tablet (100 mg total) by mouth daily as needed for erectile dysfunction. ?  ?tamsulosin 0.4 MG Caps capsule ?Commonly known as: FLOMAX ?Take 1 capsule (0.4 mg total) by mouth daily after supper. ?  ? ?  ? ? ?Allergies: No Known Allergies ? ?No family history on file. ? ?Social History:  reports that he has never smoked. He has never used smokeless tobacco. No history on file for alcohol use and drug use. ? ?ROS: ?A complete review of systems was performed.  All systems are negative except for pertinent findings as noted. ? ?Physical Exam:  ?Vital signs in last 24 hours: ?There were no vitals taken for this visit. ?Constitutional:  Alert and oriented, No acute  distress ?Cardiovascular: Regular rate  ?Respiratory: Normal respiratory effort ?GI: Abdomen is soft, nontender, nondistended, no abdominal masses. No CVAT.  There are no inguinal hernias. ?Genitourinary: Normal male phallus, testes are descended bilaterally and non-tender and without masses, scrotum is normal in appearance without lesions or masses, perineum is normal on inspection.  Prostate 80 g, symmetric, nonnodular, nontender ?Lymphatic: No lymphadenopathy ?Neurologic: Grossly intact, no focal deficits ?Psychiatric: Normal mood and affect ? ?I have reviewed prior pt notes ? ?I have reviewed notes from referring/previous physicians ? ?I have reviewed urinalysis results ? ?I have independently reviewed prior imaging-ultrasound volume ? ?I have reviewed prior PSA results ? ?Bladder scan volume-7 mL ? ? ?Impression/Assessment:  ?1.  BPH.  Significant size gland palpably with moderate symptomatology despite being on tamsulosin ? ?2.  History of elevated PSA.  Based on patient's recollection, his PSA was about 10.  He did have a significant bump in 2021 but is back down. ? ?3.  ED, has been on sildenafil in the past ? ?4.  Microscopic hematuria, new in low risk individual. ? ?Plan:  ?1.  I did add Cialis every other day to replace his sildenafil and to help lower urinary symptoms ? ?2.  Decreased caffeine in diet ? ?3.  I will see him  back in about 3 months to check his urine as well as symptomatic improvement, hopefully ? ?4.  We will get PSA results from his PCP ? ?

## 2021-10-26 ENCOUNTER — Encounter: Payer: Self-pay | Admitting: Urology

## 2021-10-26 ENCOUNTER — Ambulatory Visit (INDEPENDENT_AMBULATORY_CARE_PROVIDER_SITE_OTHER): Payer: BC Managed Care – PPO | Admitting: Urology

## 2021-10-26 VITALS — BP 138/81 | HR 74

## 2021-10-26 DIAGNOSIS — N521 Erectile dysfunction due to diseases classified elsewhere: Secondary | ICD-10-CM | POA: Diagnosis not present

## 2021-10-26 DIAGNOSIS — R972 Elevated prostate specific antigen [PSA]: Secondary | ICD-10-CM | POA: Diagnosis not present

## 2021-10-26 DIAGNOSIS — R350 Frequency of micturition: Secondary | ICD-10-CM

## 2021-10-26 DIAGNOSIS — R3915 Urgency of urination: Secondary | ICD-10-CM

## 2021-10-26 DIAGNOSIS — N401 Enlarged prostate with lower urinary tract symptoms: Secondary | ICD-10-CM

## 2021-10-26 DIAGNOSIS — R3129 Other microscopic hematuria: Secondary | ICD-10-CM

## 2021-10-26 LAB — MICROSCOPIC EXAMINATION
Renal Epithel, UA: NONE SEEN /hpf
WBC, UA: NONE SEEN /hpf (ref 0–5)

## 2021-10-26 LAB — URINALYSIS, ROUTINE W REFLEX MICROSCOPIC
Bilirubin, UA: NEGATIVE
Glucose, UA: NEGATIVE
Ketones, UA: NEGATIVE
Leukocytes,UA: NEGATIVE
Nitrite, UA: NEGATIVE
Specific Gravity, UA: 1.025 (ref 1.005–1.030)
Urobilinogen, Ur: 0.2 mg/dL (ref 0.2–1.0)
pH, UA: 6.5 (ref 5.0–7.5)

## 2021-10-26 MED ORDER — TADALAFIL 5 MG PO TABS: 5.0000 mg | ORAL_TABLET | Freq: Every day | ORAL | 11 refills | Status: DC | PRN

## 2021-11-16 ENCOUNTER — Ambulatory Visit: Payer: BC Managed Care – PPO | Admitting: Urology

## 2022-01-17 ENCOUNTER — Encounter: Payer: Self-pay | Admitting: *Deleted

## 2022-02-07 NOTE — Progress Notes (Signed)
History of Present Illness:   Elevated PSA. He previously underwent  TRUS/Bx  by Dr. Exie Parody in [place] 8.20.2015 At that time, PSA was 5.3. Apparenlty all cores were benign.   3.13.2018: Repeat TRUS/Bx.Prostatic volume 59 ml PSA 8.2 PSAD 0.14. All 12 cores benign.    4.26.2022:  His PSA at his visit last year was 32.8.  Apparently he had a recheck and it was down to 10 at Dr. Trena Platt office  4.4.2023: I requested old PSA records.  These came following this visit.  PSA levels as follows: 6.17.2014--4.3 6.18.2015--5.9 7.25.2015--6.6 5.7.2017--5.7 11.16.2017--5.9 2.23.2021--10.2 3.9.2023--6.3 3.10.2023--7.2  He did have microscopic hematuria at this visit   BPH:    He does complain about urinary symptomatology-urgency, frequency.  No significant nocturia.  Feels like he is not emptying well.  No gross hematuria or dysuria.  IPSS 2 5, quality-of-life score 6.   ED: He does have ED, he would like a prescription for Viagra.   7.18.2023: Here today for follow-up.  He has had no gross hematuria.  Tadalafil has been well-tolerated, he wants to stay on this.  He thinks it is helped a little bit for his urination.  IPSS 10, quality-of-life score 1.    No past medical history on file.  No past surgical history on file.  Home Medications:  Allergies as of 02/08/2022   No Known Allergies      Medication List        Accurate as of February 07, 2022  7:51 PM. If you have any questions, ask your nurse or doctor.          diclofenac 75 MG EC tablet Commonly known as: VOLTAREN Take 75 mg by mouth 2 (two) times daily as needed.   sildenafil 100 MG tablet Commonly known as: VIAGRA Take 1 tablet (100 mg total) by mouth daily as needed for erectile dysfunction.   sildenafil 100 MG tablet Commonly known as: VIAGRA Take 1 tablet (100 mg total) by mouth daily as needed for erectile dysfunction.   tadalafil 5 MG tablet Commonly known as: CIALIS Take 1 tablet (5 mg total) by mouth daily  as needed for erectile dysfunction.   tamsulosin 0.4 MG Caps capsule Commonly known as: FLOMAX Take 1 capsule (0.4 mg total) by mouth daily after supper.        Allergies: No Known Allergies  No family history on file.  Social History:  reports that he has never smoked. He has never used smokeless tobacco. No history on file for alcohol use and drug use.  ROS: A complete review of systems was performed.  All systems are negative except for pertinent findings as noted.  Physical Exam:  Vital signs in last 24 hours: There were no vitals taken for this visit. Constitutional:  Alert and oriented, No acute distress Cardiovascular: Regular rate  Respiratory: Normal respiratory effort GI: Abdomen is soft, nontender, nondistended, no abdominal masses. No CVAT.  Genitourinary: Normal male phallus, testes are descended bilaterally and non-tender and without masses, scrotum is normal in appearance without lesions or masses, perineum is normal on inspection. Lymphatic: No lymphadenopathy Neurologic: Grossly intact, no focal deficits Psychiatric: Normal mood and affect  I have reviewed prior pt notes  I have reviewed notes from referring/previous physicians--Dr. Trena Platt notes  I have reviewed urinalysis results  I have reviewed prior PSA results-PSA results since 2014 reviewed with the patient     Impression/Assessment:  1.  BPH, symptoms stable/improved on dual medical therapy with Cialis and tamsulosin  2.  Elevated PSA, fairly stable PSA curve since 2014.  He has had 2 negative biopsies, in 2015 and 2018  3.  ED, doing well on Cialis  4.  History of microscopic hematuria, urinalysis does not reflect this today.  Patient at low risk for urothelial malignancies  Plan:  1.  He will stay on the dual medical therapy for BPH  2.  Reassurance regarding his PSA.  3.  I will see back in 1 year for recheck

## 2022-02-08 ENCOUNTER — Encounter: Payer: Self-pay | Admitting: Urology

## 2022-02-08 ENCOUNTER — Ambulatory Visit (INDEPENDENT_AMBULATORY_CARE_PROVIDER_SITE_OTHER): Payer: BC Managed Care – PPO | Admitting: Urology

## 2022-02-08 VITALS — BP 145/82 | HR 77 | Ht 68.0 in | Wt 171.0 lb

## 2022-02-08 DIAGNOSIS — N401 Enlarged prostate with lower urinary tract symptoms: Secondary | ICD-10-CM

## 2022-02-08 DIAGNOSIS — R972 Elevated prostate specific antigen [PSA]: Secondary | ICD-10-CM

## 2022-02-08 DIAGNOSIS — R350 Frequency of micturition: Secondary | ICD-10-CM

## 2022-02-08 DIAGNOSIS — R3915 Urgency of urination: Secondary | ICD-10-CM

## 2022-02-08 DIAGNOSIS — N521 Erectile dysfunction due to diseases classified elsewhere: Secondary | ICD-10-CM

## 2022-02-08 LAB — URINALYSIS, ROUTINE W REFLEX MICROSCOPIC
Bilirubin, UA: NEGATIVE
Glucose, UA: NEGATIVE
Ketones, UA: NEGATIVE
Nitrite, UA: NEGATIVE
Protein,UA: NEGATIVE
Specific Gravity, UA: 1.015 (ref 1.005–1.030)
Urobilinogen, Ur: 0.2 mg/dL (ref 0.2–1.0)
pH, UA: 5.5 (ref 5.0–7.5)

## 2022-02-08 LAB — MICROSCOPIC EXAMINATION: Renal Epithel, UA: NONE SEEN /hpf

## 2022-02-15 ENCOUNTER — Encounter: Payer: Self-pay | Admitting: *Deleted

## 2022-02-15 NOTE — Patient Instructions (Signed)
  Procedure: colonoscopy  Estimated body mass index is 26.78 kg/m as calculated from the following:   Height as of this encounter: '5\' 7"'$  (1.702 m).   Weight as of this encounter: 171 lb (77.6 kg).   Have you had a colonoscopy before?  Yes see care everywhere. Looks like an attempt was done in 2021  Do you have family history of colon cancer  yes brother  Do you have a family history of polyps? yes  Previous colonoscopy with polyps removed? tes  Do you have a history colorectal cancer?   no  Are you diabetic?  no  Do you have a prosthetic or mechanical heart valve? no  Do you have a pacemaker/defibrillator?   no  Have you had endocarditis/atrial fibrillation?  no  Do you use supplemental oxygen/CPAP?  no  Have you had joint replacement within the last 12 months?  no  Do you tend to be constipated or have to use laxatives?  no   Do you have history of alcohol use? If yes, how much and how often.  no  Do you have history or are you using drugs? If yes, what do are you  using?  no  Have you ever had a stroke/heart attack?  no  Have you ever had a heart or other vascular stent placed,?no  Do you take weight loss medication? no   Do you take any blood-thinning medications such as: (Plavix, aspirin, Coumadin, Aggrenox, Brilinta, Xarelto, Eliquis, Pradaxa, Savaysa or Effient) no  If yes we need the name, milligram, dosage and who is prescribing doctor:               Current Outpatient Medications  Medication Sig Dispense Refill   diclofenac (VOLTAREN) 75 MG EC tablet Take 75 mg by mouth 2 (two) times daily as needed.     sildenafil (VIAGRA) 100 MG tablet Take 1 tablet (100 mg total) by mouth daily as needed for erectile dysfunction. 20 tablet 11   sildenafil (VIAGRA) 100 MG tablet Take 1 tablet (100 mg total) by mouth daily as needed for erectile dysfunction. 10 tablet prn   tadalafil (CIALIS) 5 MG tablet Take 1 tablet (5 mg total) by mouth daily as needed for erectile  dysfunction. 30 tablet 11   tamsulosin (FLOMAX) 0.4 MG CAPS capsule Take 1 capsule (0.4 mg total) by mouth daily after supper. 90 capsule 11   No current facility-administered medications for this visit.    No Known Allergies

## 2022-02-17 NOTE — Progress Notes (Signed)
Please schedule Eric Andrade thanks

## 2022-02-18 ENCOUNTER — Encounter: Payer: Self-pay | Admitting: *Deleted

## 2022-02-18 ENCOUNTER — Encounter (INDEPENDENT_AMBULATORY_CARE_PROVIDER_SITE_OTHER): Payer: Self-pay | Admitting: *Deleted

## 2022-02-18 ENCOUNTER — Encounter: Payer: Self-pay | Admitting: Internal Medicine

## 2022-02-18 NOTE — Progress Notes (Signed)
Apt letter mailed to PCP

## 2022-02-28 ENCOUNTER — Telehealth: Payer: Self-pay

## 2022-03-06 ENCOUNTER — Other Ambulatory Visit: Payer: Self-pay | Admitting: Urology

## 2022-03-06 DIAGNOSIS — N5201 Erectile dysfunction due to arterial insufficiency: Secondary | ICD-10-CM

## 2022-03-06 MED ORDER — SILDENAFIL CITRATE 100 MG PO TABS: 100.0000 mg | ORAL_TABLET | Freq: Every day | ORAL | 99 refills | Status: DC | PRN

## 2022-03-08 NOTE — Telephone Encounter (Signed)
Called patient and left voice message for patient to call our office back.

## 2022-03-08 NOTE — Telephone Encounter (Signed)
Tried calling patient with no answer. Will try back later

## 2022-03-10 NOTE — Telephone Encounter (Signed)
Tried calling patient with no answer and left voicemail for patient to call our office back.

## 2022-04-13 ENCOUNTER — Ambulatory Visit (INDEPENDENT_AMBULATORY_CARE_PROVIDER_SITE_OTHER): Payer: BC Managed Care – PPO | Admitting: Gastroenterology

## 2022-04-13 ENCOUNTER — Encounter: Payer: Self-pay | Admitting: Gastroenterology

## 2022-04-13 DIAGNOSIS — Z86010 Personal history of colonic polyps: Secondary | ICD-10-CM | POA: Diagnosis not present

## 2022-04-13 NOTE — Patient Instructions (Signed)
We will be in touch to schedule colonoscopy once I have reviewed your colonoscopy/anesthesia records.  You can use Miralax one capful daily for constipation. You can purchase this over the counter.

## 2022-04-13 NOTE — Progress Notes (Signed)
GI Office Note    Referring Provider: Monico Blitz, MD Primary Care Physician:  Monico Blitz, MD  Primary Gastroenterologist:    Chief Complaint   Chief Complaint  Patient presents with   Colonoscopy     History of Present Illness   Eric Andrade is a 66 y.o. male presenting today to schedule colonoscopy.   He has history of aspiration during his last colonoscopy in 10/2019 (Dr. Adelina Mings), procedure could not be completed. Patient states he was hospitalized for two days. He says he did not drink after midnight the day before his procedure. He says he received light sedation and he remembers being awake. He had multiple tubular adenomas removed. There were other polyps that were left behind due to need to terminate procedure early. Cecal polyp was only partially excised. In 2014, he completed colonoscopy with Dr. Burke Keels and tolerated procedure fine. He had multiple tubular adenomas removed at that time.    Patient reports BM most days. About once per month his stool may be more difficult to pass.  No melena, brbpr. No abdominal pain. No UGI symptoms.   Medications   Current Outpatient Medications  Medication Sig Dispense Refill   diclofenac (VOLTAREN) 75 MG EC tablet Take 75 mg by mouth 2 (two) times daily as needed.     sildenafil (VIAGRA) 100 MG tablet Take 1 tablet (100 mg total) by mouth daily as needed for erectile dysfunction. 20 tablet 11   tamsulosin (FLOMAX) 0.4 MG CAPS capsule Take 1 capsule (0.4 mg total) by mouth daily after supper. 90 capsule 11   No current facility-administered medications for this visit.    Allergies   Allergies as of 04/13/2022   (No Known Allergies)    Past Medical History   Past Medical History:  Diagnosis Date   BPH (benign prostatic hyperplasia)     Past Surgical History   History reviewed. No pertinent surgical history.  Past Family History   Family History  Problem Relation Age of Onset   Colon cancer  Brother        in his late 50s/early 18s    Past Social History   Social History   Socioeconomic History   Marital status: Divorced    Spouse name: Not on file   Number of children: Not on file   Years of education: Not on file   Highest education level: Not on file  Occupational History   Not on file  Tobacco Use   Smoking status: Never   Smokeless tobacco: Never  Substance and Sexual Activity   Alcohol use: Not Currently   Drug use: Not Currently   Sexual activity: Yes  Other Topics Concern   Not on file  Social History Narrative   Not on file   Social Determinants of Health   Financial Resource Strain: Not on file  Food Insecurity: Not on file  Transportation Needs: Not on file  Physical Activity: Not on file  Stress: Not on file  Social Connections: Not on file  Intimate Partner Violence: Not on file    Review of Systems   General: Negative for anorexia, weight loss, fever, chills, fatigue, weakness. Eyes: Negative for vision changes.  ENT: Negative for hoarseness, difficulty swallowing , nasal congestion. CV: Negative for chest pain, angina, palpitations, dyspnea on exertion, peripheral edema.  Respiratory: Negative for dyspnea at rest, dyspnea on exertion, cough, sputum, wheezing.  GI: See history of present illness. GU:  Negative for dysuria, hematuria, urinary  incontinence, urinary frequency, nocturnal urination.  MS: Negative for joint pain, low back pain.  Derm: Negative for rash or itching.  Neuro: Negative for weakness, abnormal sensation, seizure, frequent headaches, memory loss,  confusion.  Psych: Negative for anxiety, depression, suicidal ideation, hallucinations.  Endo: Negative for unusual weight change.  Heme: Negative for bruising or bleeding. Allergy: Negative for rash or hives.  Physical Exam   BP (!) 157/83 (BP Location: Left Arm, Patient Position: Sitting, Cuff Size: Normal)   Pulse 74   Temp 97.8 F (36.6 C) (Temporal)   Ht '5\' 8"'$   (1.727 m)   Wt 164 lb 12.8 oz (74.8 kg)   SpO2 95%   BMI 25.06 kg/m    General: Well-nourished, well-developed in no acute distress.  Head: Normocephalic, atraumatic.   Eyes: Conjunctiva pink, no icterus. Mouth: Oropharyngeal mucosa moist and pink , no lesions erythema or exudate. Neck: Supple without thyromegaly, masses, or lymphadenopathy.  Lungs: Clear to auscultation bilaterally.  Heart: Regular rate and rhythm, no murmurs rubs or gallops.  Abdomen: Bowel sounds are normal, nontender, nondistended, no hepatosplenomegaly or masses,  no abdominal bruits or hernia, no rebound or guarding.   Rectal: not performed Extremities: No lower extremity edema. No clubbing or deformities.  Neuro: Alert and oriented x 4 , grossly normal neurologically.  Skin: Warm and dry, no rash or jaundice.   Psych: Alert and cooperative, normal mood and affect.  Labs   None available  Imaging Studies   No results found.  Assessment   H/O adenomatous colon polyps: last colonoscopy was incomplete due to suspected aspiration. Cecal polyp only partially excised and other polyps not resected. Other polyps not resected. Of the polyps he had removed, multiple were tubular adenomas. He will need another colonoscopy at this time. We will determine type of sedation he received during his last exam. He will be sedated by anesthesia this time. He may require intubation for airway protection but this will be determined by anesthesiology. He will be prepped all the day before his procedure, NPO after midnight.   Mild constipation: miralax one capful daily.    PLAN   Colonoscopy in the near future. ASA 3 due to history of aspiration with last colonoscopy.  I have discussed the risks, alternatives, benefits with regards to but not limited to the risk of reaction to medication, bleeding, infection, perforation and the patient is agreeable to proceed. Written consent to be obtained. Miralax one capsule daily.      Laureen Ochs. Bobby Rumpf, [place], [facility]

## 2022-04-17 ENCOUNTER — Encounter: Payer: Self-pay | Admitting: Gastroenterology

## 2022-04-17 DIAGNOSIS — Z86010 Personal history of colonic polyps: Secondary | ICD-10-CM | POA: Insufficient documentation

## 2022-04-25 ENCOUNTER — Telehealth: Payer: Self-pay | Admitting: *Deleted

## 2022-04-25 NOTE — Telephone Encounter (Signed)
Pt left vm regarding getting his colonoscopy scheduled.  Please advise. Thank you

## 2022-04-25 NOTE — Telephone Encounter (Signed)
I was waiting for records from colonoscopy done by Dr. Adelina Mings at [facility] in 10/2019 but I have not received them. Needed op note.   Eric Andrade, have we requested?

## 2022-04-26 NOTE — Telephone Encounter (Signed)
I have not been able to get the op note report but reviewed Dr. Apolonio Schneiders office note.    Ok to schedule colonoscopy. ASA 3. Eric Andrade probably has most availability.  I have discussed the risks, alternatives, benefits with regards to but not limited to the risk of reaction to medication, bleeding, infection, perforation and the patient is agreeable to proceed. Written consent to be obtained.  Please note on comments: history of aspiration after colonoscopy in 2021 at [facility], may require intubation for airway protection.  Dx: H/O adenomatous colon polyps.   He needs to complete his bowel prep all the day before and STRICT NPO AFTER MIDNIGHT

## 2022-04-26 NOTE — Telephone Encounter (Signed)
LMTRC

## 2022-04-27 ENCOUNTER — Encounter: Payer: Self-pay | Admitting: *Deleted

## 2022-04-27 MED ORDER — PEG 3350-KCL-NA BICARB-NACL 420 G PO SOLR: 4000.0000 mL | Freq: Once | ORAL | 0 refills | Status: AC

## 2022-04-27 NOTE — Telephone Encounter (Signed)
Pt has been scheduled for 05/30/22 at 7:30 am

## 2022-04-28 ENCOUNTER — Encounter: Payer: Self-pay | Admitting: *Deleted

## 2022-05-02 NOTE — Telephone Encounter (Signed)
Sent request for records to [facility]

## 2022-05-05 NOTE — Telephone Encounter (Signed)
Received op note, sedation listed as IV sedation. Suspect conscious sedation based on patient reported he was awake during procedure.    Melanie and Dr.Carver: I want to alert you guys to this patient. I requested office to notate on the schedule/orders but I can't see that it was done. This patient had aspiration event while completing colonoscopy in 2021 with Dr. Rocco Serene in [place]. She could not finish polypectomy of large polyp and left many polyps behind.   I wanted to alert you and anesthesia of previous issue. Op note said he received "IV sedation", patient remembers being awake so not sure if it was conscious sedation.

## 2022-05-06 NOTE — Telephone Encounter (Signed)
Thanks

## 2022-05-25 ENCOUNTER — Encounter (HOSPITAL_COMMUNITY): Payer: Self-pay | Admitting: *Deleted

## 2022-05-25 ENCOUNTER — Encounter (HOSPITAL_COMMUNITY)
Admission: RE | Admit: 2022-05-25 | Discharge: 2022-05-25 | Disposition: A | Discharge status: Home / Self Care | Payer: BC Managed Care – PPO | Source: Ambulatory Visit | Attending: Internal Medicine | Admitting: Internal Medicine

## 2022-05-30 ENCOUNTER — Ambulatory Visit (HOSPITAL_COMMUNITY)
Admission: RE | Admit: 2022-05-30 | Discharge: 2022-05-30 | Disposition: A | Discharge status: Home / Self Care | Payer: BC Managed Care – PPO | Attending: Internal Medicine | Admitting: Internal Medicine

## 2022-05-30 ENCOUNTER — Ambulatory Visit (HOSPITAL_COMMUNITY): Payer: BC Managed Care – PPO | Admitting: Anesthesiology

## 2022-05-30 ENCOUNTER — Encounter (HOSPITAL_COMMUNITY)
Admission: RE | Disposition: A | Discharge status: Home / Self Care | Payer: Self-pay | Source: Home / Self Care | Attending: Internal Medicine

## 2022-05-30 ENCOUNTER — Encounter (HOSPITAL_COMMUNITY): Payer: Self-pay

## 2022-05-30 DIAGNOSIS — Z86010 Personal history of colonic polyps: Secondary | ICD-10-CM | POA: Diagnosis not present

## 2022-05-30 DIAGNOSIS — Z1211 Encounter for screening for malignant neoplasm of colon: Secondary | ICD-10-CM | POA: Diagnosis not present

## 2022-05-30 DIAGNOSIS — K5730 Diverticulosis of large intestine without perforation or abscess without bleeding: Secondary | ICD-10-CM | POA: Diagnosis not present

## 2022-05-30 DIAGNOSIS — K648 Other hemorrhoids: Secondary | ICD-10-CM | POA: Insufficient documentation

## 2022-05-30 DIAGNOSIS — D124 Benign neoplasm of descending colon: Secondary | ICD-10-CM | POA: Diagnosis not present

## 2022-05-30 DIAGNOSIS — Z09 Encounter for follow-up examination after completed treatment for conditions other than malignant neoplasm: Secondary | ICD-10-CM | POA: Diagnosis not present

## 2022-05-30 DIAGNOSIS — N400 Enlarged prostate without lower urinary tract symptoms: Secondary | ICD-10-CM | POA: Diagnosis not present

## 2022-05-30 DIAGNOSIS — Z800 Family history of malignant neoplasm of digestive organs: Secondary | ICD-10-CM | POA: Insufficient documentation

## 2022-05-30 DIAGNOSIS — M1990 Unspecified osteoarthritis, unspecified site: Secondary | ICD-10-CM | POA: Diagnosis not present

## 2022-05-30 DIAGNOSIS — D125 Benign neoplasm of sigmoid colon: Secondary | ICD-10-CM | POA: Insufficient documentation

## 2022-05-30 HISTORY — PX: POLYPECTOMY: SHX5525

## 2022-05-30 HISTORY — PX: COLONOSCOPY WITH PROPOFOL: SHX5780

## 2022-05-30 HISTORY — DX: Unspecified osteoarthritis, unspecified site: M19.90

## 2022-05-30 SURGERY — COLONOSCOPY WITH PROPOFOL
Anesthesia: General

## 2022-05-30 MED ORDER — LACTATED RINGERS IV SOLN
INTRAVENOUS | Status: DC
  Administered 2022-05-30: 1000 mL via INTRAVENOUS

## 2022-05-30 MED ORDER — PROPOFOL 10 MG/ML IV BOLUS
INTRAVENOUS | Status: DC | PRN
  Administered 2022-05-30: 50 mg via INTRAVENOUS

## 2022-05-30 MED ORDER — STERILE WATER FOR IRRIGATION IR SOLN
Status: DC | PRN
  Administered 2022-05-30: .6 mL

## 2022-05-30 MED ORDER — PROPOFOL 1000 MG/100ML IV EMUL
INTRAVENOUS | Status: AC
  Filled 2022-05-30: qty 100

## 2022-05-30 MED ORDER — PROPOFOL 500 MG/50ML IV EMUL
INTRAVENOUS | Status: DC | PRN
  Administered 2022-05-30: 150 ug/kg/min via INTRAVENOUS

## 2022-05-30 MED ORDER — METOCLOPRAMIDE HCL 5 MG/ML IJ SOLN
10.0000 mg | Freq: Once | INTRAMUSCULAR | Status: AC
  Administered 2022-05-30: 10 mg via INTRAVENOUS

## 2022-05-30 MED ORDER — METOCLOPRAMIDE HCL 5 MG/ML IJ SOLN
INTRAMUSCULAR | Status: AC
  Filled 2022-05-30: qty 2

## 2022-05-30 MED ORDER — LIDOCAINE HCL (CARDIAC) PF 100 MG/5ML IV SOSY
PREFILLED_SYRINGE | INTRAVENOUS | Status: DC | PRN
  Administered 2022-05-30: 50 mg via INTRAVENOUS

## 2022-05-30 MED ORDER — PROPOFOL 10 MG/ML IV BOLUS
INTRAVENOUS | Status: AC
  Filled 2022-05-30: qty 20

## 2022-05-30 NOTE — Op Note (Addendum)
[facility] Patient Name: Eric Andrade Procedure Date: 05/30/2022 7:15 AM MRN: [id number] Date of Birth: 04/20/1956 Attending MD: Elon Alas. Abbey Chatters , [place], [id number] CSN: [id number] Age: 65 Admit Type: Outpatient Procedure:                Colonoscopy Indications:              High risk colon cancer surveillance: Personal                            history of multiple (3 or more) adenomas Providers:                Elon Alas. Abbey Chatters, DO, Janeece Riggers, RN, Everardo Pacific Referring MD:              Medicines:                See the Anesthesia note for documentation of the                            administered medications Complications:            No immediate complications. Estimated Blood Loss:     Estimated blood loss was minimal. Procedure:                Pre-Anesthesia Assessment:                           - The anesthesia plan was to use monitored                            anesthesia care (MAC).                           After obtaining informed consent, the colonoscope                            was passed under direct vision. Throughout the                            procedure, the patient's blood pressure, pulse, and                            oxygen saturations were monitored continuously. The                            [id number]-HQ190L ([id number]) scope was introduced through                            the anus and advanced to the the cecum, identified                            by appendiceal orifice and ileocecal valve. The                            colonoscopy was performed without  difficulty. The                            patient tolerated the procedure well. The quality                            of the bowel preparation was evaluated using the                            BBPS [facility] Bowel Preparation Scale) with scores                            of: Right Colon = 2 (minor amount of residual                            staining, small fragments of  stool and/or opaque                            liquid, but mucosa seen well), Transverse Colon = 2                            (minor amount of residual staining, small fragments                            of stool and/or opaque liquid, but mucosa seen                            well) and Left Colon = 2 (minor amount of residual                            staining, small fragments of stool and/or opaque                            liquid, but mucosa seen well). The total BBPS score                            equals 6. The quality of the bowel preparation was                            good. Scope In: 7:41:58 AM Scope Out: 8:04:40 AM Scope Withdrawal Time: 0 hours 19 minutes 56 seconds  Total Procedure Duration: 0 hours 22 minutes 42 seconds  Findings:      The perianal and digital rectal examinations were normal.      Non-bleeding internal hemorrhoids were found during endoscopy.      Multiple small-mouthed diverticula were found in the sigmoid colon.      Two sessile polyps were found in the sigmoid colon and descending colon.       The polyps were 4 to 5 mm in size. These polyps were removed with a cold       snare. Resection and retrieval were complete.      The exam was otherwise without abnormality. Impression:               - Non-bleeding internal  hemorrhoids.                           - Diverticulosis in the sigmoid colon.                           - Two 4 to 5 mm polyps in the sigmoid colon and in                            the descending colon, removed with a cold snare.                            Resected and retrieved.                           - Cecum/ascending colon evaluated numerous times.                            Did not see residual polyp tissue.                           - The examination was otherwise normal. Moderate Sedation:      Per Anesthesia Care Recommendation:           - Patient has a contact number available for                            emergencies. The  signs and symptoms of potential                            delayed complications were discussed with the                            patient. Return to normal activities tomorrow.                            Written discharge instructions were provided to the                            patient.                           - Resume previous diet.                           - Continue present medications.                           - Await pathology results.                           - Repeat colonoscopy in 5 years for surveillance.                           - Return to GI clinic PRN. Procedure Code(s):        --- Professional ---  45385, Colonoscopy, flexible; with removal of                            tumor(s), polyp(s), or other lesion(s) by snare                            technique Diagnosis Code(s):        --- Professional ---                           Z86.010, Personal history of colonic polyps                           K64.8, Other hemorrhoids                           D12.5, Benign neoplasm of sigmoid colon                           D12.4, Benign neoplasm of descending colon                           K57.30, Diverticulosis of large intestine without                            perforation or abscess without bleeding CPT copyright 2022 American Medical Association. All rights reserved. The codes documented in this report are preliminary and upon coder review may  be revised to meet current compliance requirements. Elon Alas. Abbey Chatters, DO [place] Abbey Chatters, DO 05/30/2022 8:09:26 AM This report has been signed electronically. Number of Addenda: 0

## 2022-05-30 NOTE — Discharge Instructions (Addendum)
  Colonoscopy Discharge Instructions  Read the instructions outlined below and refer to this sheet in the next few weeks. These discharge instructions provide you with general information on caring for yourself after you leave the hospital. Your doctor may also give you specific instructions. While your treatment has been planned according to the most current medical practices available, unavoidable complications occasionally occur.   ACTIVITY You may resume your regular activity, but move at a slower pace for the next 24 hours.  Take frequent rest periods for the next 24 hours.  Walking will help get rid of the air and reduce the bloated feeling in your belly (abdomen).  No driving for 24 hours (because of the medicine (anesthesia) used during the test).   Do not sign any important legal documents or operate any machinery for 24 hours (because of the anesthesia used during the test).  NUTRITION Drink plenty of fluids.  You may resume your normal diet as instructed by your doctor.  Begin with a light meal and progress to your normal diet. Heavy or fried foods are harder to digest and may make you feel sick to your stomach (nauseated).  Avoid alcoholic beverages for 24 hours or as instructed.  MEDICATIONS You may resume your normal medications unless your doctor tells you otherwise.  WHAT YOU CAN EXPECT TODAY Some feelings of bloating in the abdomen.  Passage of more gas than usual.  Spotting of blood in your stool or on the toilet paper.  IF YOU HAD POLYPS REMOVED DURING THE COLONOSCOPY: No aspirin products for 7 days or as instructed.  No alcohol for 7 days or as instructed.  Eat a soft diet for the next 24 hours.  FINDING OUT THE RESULTS OF YOUR TEST Not all test results are available during your visit. If your test results are not back during the visit, make an appointment with your caregiver to find out the results. Do not assume everything is normal if you have not heard from your  caregiver or the medical facility. It is important for you to follow up on all of your test results.  SEEK IMMEDIATE MEDICAL ATTENTION IF: You have more than a spotting of blood in your stool.  Your belly is swollen (abdominal distention).  You are nauseated or vomiting.  You have a temperature over 101.  You have abdominal pain or discomfort that is severe or gets worse throughout the day.   Your colonoscopy revealed 2 polyp(s) which I removed successfully. Await pathology results, my office will contact you. I recommend repeating colonoscopy in 5 years for surveillance purposes.   You also have diverticulosis and internal hemorrhoids. I would recommend increasing fiber in your diet or adding OTC Benefiber/Metamucil. Be sure to drink at least 4 to 6 glasses of water daily. Follow-up with GI as needed.   I hope you have a great rest of your week!  Elon Alas. Abbey Chatters, D.O. Gastroenterology and Hepatology [facility]

## 2022-05-30 NOTE — Anesthesia Postprocedure Evaluation (Signed)
Anesthesia Post Note  Patient: Eric Andrade  Procedure(s) Performed: COLONOSCOPY WITH PROPOFOL POLYPECTOMY  Patient location during evaluation: Phase II Anesthesia Type: General Level of consciousness: awake and alert and oriented Pain management: pain level controlled Vital Signs Assessment: post-procedure vital signs reviewed and stable Respiratory status: spontaneous breathing, nonlabored ventilation and respiratory function stable Cardiovascular status: blood pressure returned to baseline and stable Postop Assessment: no apparent nausea or vomiting Anesthetic complications: no  No notable events documented.   Last Vitals:  Vitals:   05/30/22 0647 05/30/22 0811  BP: (!) 124/91 99/64  Pulse:  61  Resp: 18 20  Temp: 37.1 C (!) 36.4 C  SpO2: 96% 95%    Last Pain:  Vitals:   05/30/22 0811  TempSrc: Oral  PainSc: 0-No pain                 Coner Gibbard C Willaim Mode

## 2022-05-30 NOTE — Anesthesia Preprocedure Evaluation (Signed)
Anesthesia Evaluation  Patient identified by MRN, date of birth, ID band Patient awake    Reviewed: Allergy & Precautions, H&P , NPO status , Patient's Chart, lab work & pertinent test results  History of Anesthesia Complications (+) history of anesthetic complications (aspiration during colonoscopy)  Airway Mallampati: I  TM Distance: >3 FB Neck ROM: Full    Dental  (+) Edentulous Upper, Edentulous Lower   Pulmonary neg pulmonary ROS   Pulmonary exam normal breath sounds clear to auscultation       Cardiovascular Exercise Tolerance: Good negative cardio ROS Normal cardiovascular exam Rhythm:Regular Rate:Normal     Neuro/Psych negative neurological ROS  negative psych ROS   GI/Hepatic negative GI ROS, Neg liver ROS,,,  Endo/Other  negative endocrine ROS    Renal/GU negative Renal ROS Bladder dysfunction (BPH)      Musculoskeletal  (+) Arthritis , Osteoarthritis,    Abdominal   Peds negative pediatric ROS (+)  Hematology negative hematology ROS (+)   Anesthesia Other Findings   Reproductive/Obstetrics negative OB ROS                             Anesthesia Physical Anesthesia Plan  ASA: 2  Anesthesia Plan: General   Post-op Pain Management: Minimal or no pain anticipated   Induction: Intravenous  PONV Risk Score and Plan: Propofol infusion  Airway Management Planned: Nasal Cannula and Natural Airway  Additional Equipment:   Intra-op Plan:   Post-operative Plan:   Informed Consent: I have reviewed the patients History and Physical, chart, labs and discussed the procedure including the risks, benefits and alternatives for the proposed anesthesia with the patient or authorized representative who has indicated his/her understanding and acceptance.     Dental advisory given  Plan Discussed with: CRNA and Surgeon  Anesthesia Plan Comments:        Anesthesia Quick  Evaluation

## 2022-05-30 NOTE — Transfer of Care (Signed)
Immediate Anesthesia Transfer of Care Note  Patient: Eric Andrade  Procedure(s) Performed: COLONOSCOPY WITH PROPOFOL POLYPECTOMY  Patient Location: Short Stay  Anesthesia Type:General  Level of Consciousness: awake, alert , oriented, and patient cooperative  Airway & Oxygen Therapy: Patient Spontanous Breathing  Post-op Assessment: Report given to RN, Post -op Vital signs reviewed and stable, and Patient moving all extremities X 4  Post vital signs: Reviewed and stable  Last Vitals:  Vitals Value Taken Time  BP 99/64 05/30/22 0811  Temp 36.4 C 05/30/22 0811  Pulse 61 05/30/22 0811  Resp 20 05/30/22 0811  SpO2 95 % 05/30/22 0811    Last Pain:  Vitals:   05/30/22 0811  TempSrc: Oral  PainSc: 0-No pain      Patients Stated Pain Goal: 8 (24/81/85 9093)  Complications: No notable events documented.

## 2022-05-30 NOTE — H&P (Signed)
Primary Care Physician:  Monico Blitz, MD Primary Gastroenterologist:  Dr. Abbey Chatters  Pre-Procedure History & Physical: HPI:  Eric Andrade is a 66 y.o. male is here  for a colonoscopy to be performed for colon surveillance purposes, personal history of adenomatous colon polyps.  Last colonoscopy April 2021 incomplete resection of multiple polyps, suffered aspiration event, procedure terminated early.   Past Medical History:  Diagnosis Date   Arthritis    BPH (benign prostatic hyperplasia)     Past Surgical History:  Procedure Laterality Date   CIRCUMCISION     HERNIA REPAIR     umbilical   HYDROCELE EXCISION Left    LEG SURGERY Bilateral    WRIST SURGERY Right     Prior to Admission medications   Medication Sig Start Date End Date Taking? Authorizing Provider  diclofenac (VOLTAREN) 75 MG EC tablet Take 75 mg by mouth 2 (two) times daily as needed. 09/29/21  Yes [provider]  tamsulosin (FLOMAX) 0.4 MG CAPS capsule Take 1 capsule (0.4 mg total) by mouth daily after supper. 12/04/20  Yes Dahlstedt, Annie Main, MD  sildenafil (VIAGRA) 100 MG tablet Take 1 tablet (100 mg total) by mouth daily as needed for erectile dysfunction. 11/19/19   Franchot Gallo, MD    Allergies as of 04/26/2022   (No Known Allergies)    Family History  Problem Relation Age of Onset   Colon cancer Brother        in his late 50s/early 37s    Social History   Socioeconomic History   Marital status: Divorced    Spouse name: Not on file   Number of children: Not on file   Years of education: Not on file   Highest education level: Not on file  Occupational History   Not on file  Tobacco Use   Smoking status: Never   Smokeless tobacco: Never  Substance and Sexual Activity   Alcohol use: Yes    Comment: rare   Drug use: Not Currently   Sexual activity: Yes  Other Topics Concern   Not on file  Social History Narrative   Not on file   Social Determinants of Health   Financial  Resource Strain: Not on file  Food Insecurity: Not on file  Transportation Needs: Not on file  Physical Activity: Not on file  Stress: Not on file  Social Connections: Not on file  Intimate Partner Violence: Not on file    Review of Systems: See HPI, otherwise negative ROS  Physical Exam: Vital signs in last 24 hours: Temp:  [98.7 F (37.1 C)] 98.7 F (37.1 C) (11/06 0647) Resp:  [18] 18 (11/06 0647) BP: (124)/(91) 124/91 (11/06 0647) SpO2:  [96 %] 96 % (11/06 0647) Weight:  [74.8 kg] 74.8 kg (11/06 0647)   General:   Alert,  Well-developed, well-nourished, pleasant and cooperative in NAD Head:  Normocephalic and atraumatic. Eyes:  Sclera clear, no icterus.   Conjunctiva pink. Ears:  Normal auditory acuity. Nose:  No deformity, discharge,  or lesions. Mouth:  No deformity or lesions, dentition normal. Neck:  Supple; no masses or thyromegaly. Lungs:  Clear throughout to auscultation.   No wheezes, crackles, or rhonchi. No acute distress. Heart:  Regular rate and rhythm; no murmurs, clicks, rubs,  or gallops. Abdomen:  Soft, nontender and nondistended. No masses, hepatosplenomegaly or hernias noted. Normal bowel sounds, without guarding, and without rebound.   Msk:  Symmetrical without gross deformities. Normal posture. Extremities:  Without clubbing or edema. Neurologic:  Alert and  oriented x4;  grossly normal neurologically. Skin:  Intact without significant lesions or rashes. Cervical Nodes:  No significant cervical adenopathy. Psych:  Alert and cooperative. Normal mood and affect.  Impression/Plan: Eric Andrade is here for a colonoscopy to be performed for colon surveillance purposes, personal history of adenomatous colon polyps.  Last colonoscopy April 2021 incomplete resection of multiple polyps, suffered aspiration event, procedure terminated early.   The risks of the procedure including infection, bleed, or perforation as well as benefits, limitations, alternatives  and imponderables have been reviewed with the patient. Questions have been answered. All parties agreeable.

## 2022-05-31 LAB — SURGICAL PATHOLOGY

## 2022-06-06 ENCOUNTER — Encounter (HOSPITAL_COMMUNITY): Payer: Self-pay | Admitting: Internal Medicine

## 2022-06-07 ENCOUNTER — Other Ambulatory Visit (HOSPITAL_COMMUNITY): Payer: BC Managed Care – PPO

## 2023-01-17 ENCOUNTER — Ambulatory Visit: Payer: BC Managed Care – PPO | Admitting: Urology

## 2023-02-07 ENCOUNTER — Ambulatory Visit: Payer: Self-pay | Admitting: Urology

## 2023-02-17 ENCOUNTER — Encounter: Payer: Self-pay | Admitting: Urology

## 2023-03-06 NOTE — Progress Notes (Signed)
History of Present Illness:    Elevated PSA. He previously underwent  TRUS/Bx  by Dr. Nechama Guard in [place] 8.20.2015 At that time, PSA was 5.3. Apparenlty all cores were benign.   3.13.2018: Repeat TRUS/Bx.Prostatic volume 59 ml PSA 8.2 PSAD 0.14. All 12 cores benign.    4.26.2022:  His PSA at his visit last year was 32.8.  Apparently he had a recheck and it was down to 10 at Dr. Margaretmary Eddy office  4.4.2023: I requested old PSA records.  These came following this visit.  PSA levels as follows: 6.17.2014--4.3 6.18.2015--5.9 7.25.2015--6.6 5.7.2017--5.7 11.16.2017--5.9 2.23.2021--10.2 3.9.2023--6.3 3.10.2023--7.2 3.27.2024--8.8  He did have microscopic hematuria at this visit   BPH: He has been on tamsulosin as well as tadalafil in the past    ED: On daily tadalafil  8.13.2024: Here today to follow-up emergency room visit in [place] for urinary retention with catheter placement.  Patient presented with urinary retention, residual urine volume 800 mL.  He has been on his medical therapy since that time.  Past Medical History:  Diagnosis Date   Arthritis    BPH (benign prostatic hyperplasia)     Past Surgical History:  Procedure Laterality Date   CIRCUMCISION     COLONOSCOPY WITH PROPOFOL N/A 05/30/2022   Procedure: COLONOSCOPY WITH PROPOFOL;  Surgeon: Lanelle Bal, DO;  [facility];  Service: Endoscopy;  Laterality: N/A;  7:30 am   HERNIA REPAIR     umbilical   HYDROCELE EXCISION Left    LEG SURGERY Bilateral    POLYPECTOMY  05/30/2022   Procedure: POLYPECTOMY;  Surgeon: Lanelle Bal, DO;  [facility]: [facility];  Service: Endoscopy;;   WRIST SURGERY Right     Home Medications:  Allergies as of 03/07/2023   No Known Allergies      Medication List        Accurate as of March 06, 2023 12:38 PM. If you have any questions, ask your nurse or doctor.          diclofenac 75 MG EC tablet Commonly known as: VOLTAREN Take 75 mg by mouth 2 (two) times daily  as needed.   sildenafil 100 MG tablet Commonly known as: VIAGRA Take 1 tablet (100 mg total) by mouth daily as needed for erectile dysfunction.   tamsulosin 0.4 MG Caps capsule Commonly known as: FLOMAX Take 1 capsule (0.4 mg total) by mouth daily after supper.        Allergies: No Known Allergies  Family History  Problem Relation Age of Onset   Colon cancer Brother        in his late 50s/early 64s    Social History:  reports that he has never smoked. He has never used smokeless tobacco. He reports current alcohol use. He reports that he does not currently use drugs.  ROS: A complete review of systems was performed.  All systems are negative except for pertinent findings as noted.  Physical Exam:  Vital signs in last 24 hours: There were no vitals taken for this visit. Constitutional:  Alert and oriented, No acute distress Cardiovascular: Regular rate  Respiratory: Normal respiratory effort Neurologic: Grossly intact, no focal deficits Psychiatric: Normal mood and affect  I have reviewed prior pt notes  I have reviewed notes from referring/previous physicians--emergency room note reviewed  I have independently reviewed prior imaging-prostate ultrasound volume  I have reviewed prior PSA results    Impression/Assessment:  BPH with retention, passed TOB today  Plan:  1.  3 days  of sulfa sent in  2.  Continue dual medical therapy for his BPH  3.  I will see back in 2 weeks for recheck with bladder scan

## 2023-03-07 ENCOUNTER — Ambulatory Visit (INDEPENDENT_AMBULATORY_CARE_PROVIDER_SITE_OTHER): Payer: BC Managed Care – PPO | Admitting: Urology

## 2023-03-07 ENCOUNTER — Encounter: Payer: Self-pay | Admitting: Urology

## 2023-03-07 VITALS — BP 119/70 | HR 77

## 2023-03-07 DIAGNOSIS — N5201 Erectile dysfunction due to arterial insufficiency: Secondary | ICD-10-CM | POA: Diagnosis not present

## 2023-03-07 DIAGNOSIS — R339 Retention of urine, unspecified: Secondary | ICD-10-CM

## 2023-03-07 DIAGNOSIS — N401 Enlarged prostate with lower urinary tract symptoms: Secondary | ICD-10-CM

## 2023-03-07 DIAGNOSIS — R972 Elevated prostate specific antigen [PSA]: Secondary | ICD-10-CM

## 2023-03-07 MED ORDER — SULFAMETHOXAZOLE-TRIMETHOPRIM 800-160 MG PO TABS: 1.0000 | ORAL_TABLET | Freq: Two times a day (BID) | ORAL | 0 refills | Status: DC

## 2023-03-07 NOTE — Progress Notes (Signed)
Fill and Pull Catheter Removal  Patient is present today for a catheter removal.  Patient was cleaned and prepped in a sterile fashion of sterile water/ saline was instilled into the bladder when the patient felt the urge to urinate. 10 ml of water was then drained from the balloon.  A 16 FR silicone foley cath was removed from the bladder no complications were noted .  Patient as then given some time to void on their own.  Patient can void  75 ml on their own after some time.  Patient tolerated well.  Performed by: Kennyth Lose, CMA  Follow up/ Additional notes: Return in 2-3 weeks for PVR and office visit

## 2023-03-13 ENCOUNTER — Telehealth: Payer: Self-pay

## 2023-03-13 NOTE — Telephone Encounter (Signed)
Patient returned call to office. Appointment offered to see MD 03/14/2023. Patient unable to make that appointment.  Patient scheduled for 09/03 and will keep scheduled follow up visit.

## 2023-03-13 NOTE — Telephone Encounter (Signed)
Patient called and left a voicemail. Returned patient call. No answer.  Per patient voicemail- patient had catheter replaced and called to see if needing to wait until next scheduled visit to have catheter removed.  Left message for patient to return call to office to discuss with nursing staff.

## 2023-03-27 NOTE — Progress Notes (Signed)
History of Present Illness:   Elevated PSA. He previously underwent  TRUS/Bx  by Dr. Nechama Guard in [place] 8.20.2015 At that time, PSA was 5.3. Apparenlty all cores were benign.   3.13.2018: Repeat TRUS/Bx.Prostatic volume 59 ml PSA 8.2 PSAD 0.14. All 12 cores benign.    5.26.2021: Prostate MRI.  Volume 77 mL.  No intra or extra prostatic abnormalities seen.  4.26.2022:  His PSA at his visit last year was 32.8.  Apparently he had a recheck and it was down to 10 at Dr. Margaretmary Eddy office   4.4.2023: I requested old PSA records.  These came following this visit.  PSA levels as follows: 6.17.2014--4.3 6.18.2015--5.9 7.25.2015--6.6 5.7.2017--5.7 11.16.2017--5.9 2.23.2021--10.2 3.9.2023--6.3 3.10.2023--7.2 3.27.2024--8.8   He did have microscopic hematuria at this visit   BPH: He has been on tamsulosin as well as tadalafil in the past     ED: On daily tadalafil   8.13.2024: Here today to follow-up emergency room visit in [place] for urinary retention with catheter placement.  Patient presented with urinary retention, residual urine volume 800 mL.  He has been on his medical therapy since that time.  He passed TOV at this appt.  9.3.2024: Here for check 2 weeks after TOV.  Fortunately, he went into retention again after his successful T OV.  Past Medical History:  Diagnosis Date   Arthritis    BPH (benign prostatic hyperplasia)     Past Surgical History:  Procedure Laterality Date   CIRCUMCISION     COLONOSCOPY WITH PROPOFOL N/A 05/30/2022   Procedure: COLONOSCOPY WITH PROPOFOL;  Surgeon: Lanelle Bal, DO;  [facility];  Service: Endoscopy;  Laterality: N/A;  7:30 am   HERNIA REPAIR     umbilical   HYDROCELE EXCISION Left    LEG SURGERY Bilateral    POLYPECTOMY  05/30/2022   Procedure: POLYPECTOMY;  Surgeon: Lanelle Bal, DO;  [facility];  Service: Endoscopy;;   WRIST SURGERY Right     Home Medications:  Allergies as of 03/28/2023   No Known  Allergies      Medication List        Accurate as of March 27, 2023  4:08 PM. If you have any questions, ask your nurse or doctor.          diclofenac 75 MG EC tablet Commonly known as: VOLTAREN Take 75 mg by mouth 2 (two) times daily as needed.   sildenafil 100 MG tablet Commonly known as: VIAGRA Take 1 tablet (100 mg total) by mouth daily as needed for erectile dysfunction.   sulfamethoxazole-trimethoprim 800-160 MG tablet Commonly known as: BACTRIM DS Take 1 tablet by mouth 2 (two) times daily.   tamsulosin 0.4 MG Caps capsule Commonly known as: FLOMAX Take 1 capsule (0.4 mg total) by mouth daily after supper.        Allergies: No Known Allergies  Family History  Problem Relation Age of Onset   Colon cancer Brother        in his late 50s/early 5s    Social History:  reports that he has never smoked. He has never used smokeless tobacco. He reports current alcohol use. He reports that he does not currently use drugs.  ROS: A complete review of systems was performed.  All systems are negative except for pertinent findings as noted.  Physical Exam:  Vital signs in last 24 hours: There were no vitals taken for this visit. Constitutional:  Alert and oriented, No acute distress Cardiovascular: Regular  rate  Respiratory: Normal respiratory effort Neurologic: Grossly intact, no focal deficits Psychiatric: Normal mood and affect  I have reviewed prior pt notes  I have reviewed urinalysis results  I have independently reviewed prior imaging-prostate ultrasound, prostate MRI  I have reviewed prior PSA and pathology results  Cystoscopy Procedure Note:  Indication: BPH with retention  After informed consent and discussion of the procedure and its risks, LEIGH ANASTACIO was positioned and prepped in the standard fashion.  Cystoscopy was performed with a flexible cystoscope.   Findings: Urethra: No stricture/lesion Prostate: Obstructive with bilobar  hyperplasia Bladder neck: No contracture Ureteral orifices: Normal bilaterally Bladder: No overt urothelial abnormality noted  The patient tolerated the procedure well.     Impression/Assessment:  1.  BPH with retention, unsuccessful TOVlast visit with him going back to the emergency room for catheter replacement  2.  History of elevated PSA with negative biopsy x 2  Plan:  1.  I have suggested proceeding with TURP.  The patient would like this  2.  He does not want to go to [facility] for this-I will correspond with Dr. Ronne Binning for scheduling for this to be done in [place]

## 2023-03-28 ENCOUNTER — Encounter: Payer: Self-pay | Admitting: Urology

## 2023-03-28 ENCOUNTER — Ambulatory Visit (INDEPENDENT_AMBULATORY_CARE_PROVIDER_SITE_OTHER): Payer: BC Managed Care – PPO | Admitting: Urology

## 2023-03-28 VITALS — BP 117/74 | HR 87

## 2023-03-28 DIAGNOSIS — R972 Elevated prostate specific antigen [PSA]: Secondary | ICD-10-CM

## 2023-03-28 DIAGNOSIS — N401 Enlarged prostate with lower urinary tract symptoms: Secondary | ICD-10-CM

## 2023-03-28 DIAGNOSIS — R339 Retention of urine, unspecified: Secondary | ICD-10-CM

## 2023-03-28 NOTE — Progress Notes (Signed)
Cath Change/ Replacement  Patient is present today for a catheter change due to urinary retention.  10ml of water was removed from the balloon, a 16FR foley cath was removed without difficulty.  Patient was cleaned and prepped in a sterile fashion with betadine and 2% lidocaine jelly was instilled into the urethra. A 16 FR foley cath was replaced into the bladder, no complications were noted. Urine return was noted 15 ml and urine was yellow in color. The balloon was filled with 10ml of sterile water. A leg bag was attached for drainage.  A night bag was also given to the patient and patient was given instruction on how to change from one bag to another. Patient was given proper instruction on catheter care.    Performed by: Kennyth Lose, CMA  Follow up: No follow-ups on file.

## 2023-04-02 ENCOUNTER — Other Ambulatory Visit: Payer: Self-pay | Admitting: Urology

## 2023-04-02 DIAGNOSIS — N521 Erectile dysfunction due to diseases classified elsewhere: Secondary | ICD-10-CM

## 2023-04-17 ENCOUNTER — Other Ambulatory Visit: Payer: Self-pay

## 2023-04-17 ENCOUNTER — Telehealth: Payer: Self-pay | Admitting: Urology

## 2023-04-17 DIAGNOSIS — N401 Enlarged prostate with lower urinary tract symptoms: Secondary | ICD-10-CM

## 2023-04-17 NOTE — Telephone Encounter (Signed)
Patient returning call about his procedure, he wants you to leave a detailed message on his cellphone,  he doesn't want to play phone tag.

## 2023-04-18 NOTE — Telephone Encounter (Signed)
Patient called to get the details about his procedure, I  let him know that Eric Andrade is in clinic today and she will call him tomorrow, he said he has to work tomorrow so leave all the details on his voicemail .

## 2023-04-20 NOTE — Telephone Encounter (Signed)
I spoke with patient regarding surgery date.  Communication noted in surgery work que.

## 2023-05-08 ENCOUNTER — Telehealth: Payer: Self-pay

## 2023-05-08 NOTE — Telephone Encounter (Signed)
[employer] FMLA Paperwork received today via fax.,

## 2023-05-09 NOTE — Telephone Encounter (Signed)
FMLA paperwork given to Dr. Retta Diones to complete. Pending.

## 2023-06-07 ENCOUNTER — Encounter: Payer: BC Managed Care – PPO | Admitting: Urology

## 2023-06-13 ENCOUNTER — Ambulatory Visit: Payer: BC Managed Care – PPO | Admitting: Urology

## 2023-06-15 ENCOUNTER — Ambulatory Visit (INDEPENDENT_AMBULATORY_CARE_PROVIDER_SITE_OTHER): Payer: BC Managed Care – PPO

## 2023-06-15 DIAGNOSIS — R339 Retention of urine, unspecified: Secondary | ICD-10-CM | POA: Diagnosis not present

## 2023-06-15 DIAGNOSIS — T83038A Leakage of other indwelling urethral catheter, initial encounter: Secondary | ICD-10-CM

## 2023-06-15 NOTE — Progress Notes (Addendum)
Patient in office today for scheduled catheter change. Patient informed nurse that he went to ER last night to have catheter changed due to it not draining properly. Patient states that he will need it changed again due to urine leaking around the catheter. Foley catheter noted to be patent and intact. Balloon deflated and catheter pushed in further. Catheter anchor place and catheter attached. Foley bag noted to have 300cc of cloudy urine. Foley bag emptied. 1 box of Gemtesa samples given to pt per provider to help with bladder spasms. Patient scheduled for surgery 06/26/23.

## 2023-06-19 NOTE — Patient Instructions (Signed)
ILIAN GUIZAR  06/19/2023     @PREFPERIOPPHARMACY @   Your procedure is scheduled on  06/26/2023.   Report to [facility] at  0800  A.M.    Call this number if you have problems the morning of surgery:  [phone]  If you experience any cold or flu symptoms such as cough, fever, chills, shortness of breath, etc. between now and your scheduled surgery, please notify us at the above number.   Remember:       Use your inhaler before you come and bring your rescue inhaler with you.  Do not eat after midnight.    You may drink clear liquids until 0600 am on 06/26/2023.    Clear liquids allowed are:                    Water, Juice (No red color; non-citric and without pulp; diabetics please choose diet or no sugar options), Carbonated beverages (diabetics please choose diet or no sugar options), Clear Tea (No creamer, milk, or cream, including half & half and powdered creamer), Black Coffee Only (No creamer, milk or cream, including half & half and powdered creamer), and Clear Sports drink (No red color; diabetics please choose diet or no sugar options)    Take these medicines the morning of surgery with A SIP OF WATER                                               None.    Do not wear jewelry, make-up or nail polish, including gel polish,  artificial nails, or any other type of covering on natural nails (fingers and  toes).  Do not wear lotions, powders, or perfumes, or deodorant.  Do not shave 48 hours prior to surgery.  Men may shave face and neck.  Do not bring valuables to the hospital.  [facility] is not responsible for any belongings or valuables.  Contacts, dentures or bridgework may not be worn into surgery.  Leave your suitcase in the car.  After surgery it may be brought to your room.  For patients admitted to the hospital, discharge time will be determined by your treatment team.  Patients discharged the day of surgery will not be allowed to drive home and  must have someone with them for 24 hours.    Special instructions:   DO NOT smoke tobacco or vape for 24 hours before your procedure.  Please read over the following fact sheets that you were given. Coughing and Deep Breathing, Surgical Site Infection Prevention, Anesthesia Post-op Instructions, and Care and Recovery After Surgery        Transurethral Resection of the Prostate, Care After The following information offers guidance on how to care for yourself after your procedure. Your health care provider may also give you more specific instructions. If you have problems or questions, contact your health care provider. What can I expect after the procedure? After the procedure, it is common to have: Mild pain in your lower abdomen. Soreness or mild discomfort in your penis or when you urinate. This is from having the catheter inserted during the procedure. A sudden urge to urinate (urgency). A need to urinate often. A small amount of blood in your urine. You may notice some small blood clots in your urine. These are normal. Follow these  instructions at home: Medicines Take over-the-counter and prescription medicines only as told by your health care provider. If you were prescribed an antibiotic medicine, take it as told by your health care provider. Do not stop taking the antibiotic even if you start to feel better. Activity  Rest as told by your health care provider. Avoid sitting for a long time without moving. Get up to take short walks every 1-2 hours. This is important to improve blood flow and breathing. Ask for help if you feel weak or unsteady. You may increase your physical activity gradually as you start to feel better. Do not drive or operate machinery until your health care provider says that it is safe. Do not ride in a car for long periods of time, or as told by your health care provider. Avoid intense physical activity for as long as told by your health care provider. Do  not lift anything that is heavier than 10 lb (4.5 kg), or the limit that you are told, until your health care provider says that it is safe. Do not have sex until your health care provider approves. Return to your normal activities as told by your health care provider. Ask your health care provider what activities are safe for you. Preventing constipation You may need to take these actions to prevent or treat constipation: Drink enough fluid to keep your urine pale yellow. Take over-the-counter or prescription medicines. Eat foods that are high in fiber, such as beans, whole grains, and fresh fruits and vegetables. Limit foods that are high in fat and processed sugars, such as fried or sweet foods.  General instructions Do not strain when you have a bowel movement. Straining may lead to bleeding from the prostate. This may cause blood clots and trouble urinating. Do not use any products that contain nicotine or tobacco. These products include cigarettes, chewing tobacco, and vaping devices, such as e-cigarettes. If you need help quitting, ask your health care provider. If you go home with a tube draining your urine (urinary catheter), care for the catheter as told by your health care provider. Wear compression stockings as told by your health care provider. These stockings help to prevent blood clots and reduce swelling in your legs. Keep all follow-up visits. This is important. Contact a health care provider if: You have signs of infection, such as: Fever or chills. Urine that smells very bad. Swelling around your urethra that is getting worse. Swelling in your penis or testicles. You have difficulty urinating. You have pain that gets worse or does not improve with medicine. You have blood in your urine that does not go away after 1 week of resting and drinking more fluids. You have trouble having a bowel movement. You have trouble having or keeping an erection. No semen comes out during  orgasm (dry ejaculation). You have a urinary catheter in place, and you have: Spasms or pain. Problems with your catheter or your catheter is blocked. Get help right away if: You are unable to urinate. You are having more blood clots in your urine instead of fewer. You have: Large blood clots. A lot of blood in your urine. Pain in your back or lower abdomen. You have difficulty breathing or shortness of breath. You develop swelling or pain in your leg. These symptoms may be an emergency. Get help right away. Call 911. Do not wait to see if the symptoms will go away. Do not drive yourself to the hospital. Summary After the procedure, it is  common to have a small amount of blood in your urine. Follow restrictions about lifting and sexual activity as told by your health care provider. Ask what activities are safe for you. Keep all follow-up visits. This is important. This information is not intended to replace advice given to you by your health care provider. Make sure you discuss any questions you have with your health care provider. Document Revised: 04/06/2021 Document Reviewed: 04/06/2021 Elsevier Patient Education  2024 [employer] General Anesthesia, Adult, Care After The following information offers guidance on how to care for yourself after your procedure. Your health care provider may also give you more specific instructions. If you have problems or questions, contact your health care provider. What can I expect after the procedure? After the procedure, it is common for people to: Have pain or discomfort at the IV site. Have nausea or vomiting. Have a sore throat or hoarseness. Have trouble concentrating. Feel cold or chills. Feel weak, sleepy, or tired (fatigue). Have soreness and body aches. These can affect parts of the body that were not involved in surgery. Follow these instructions at home: For the time period you were told by your health care provider:  Rest. Do  not participate in activities where you could fall or become injured. Do not drive or use machinery. Do not drink alcohol. Do not take sleeping pills or medicines that cause drowsiness. Do not make important decisions or sign legal documents. Do not take care of children on your own. General instructions Drink enough fluid to keep your urine pale yellow. If you have sleep apnea, surgery and certain medicines can increase your risk for breathing problems. Follow instructions from your health care provider about wearing your sleep device: Anytime you are sleeping, including during daytime naps. While taking prescription pain medicines, sleeping medicines, or medicines that make you drowsy. Return to your normal activities as told by your health care provider. Ask your health care provider what activities are safe for you. Take over-the-counter and prescription medicines only as told by your health care provider. Do not use any products that contain nicotine or tobacco. These products include cigarettes, chewing tobacco, and vaping devices, such as e-cigarettes. These can delay incision healing after surgery. If you need help quitting, ask your health care provider. Contact a health care provider if: You have nausea or vomiting that does not get better with medicine. You vomit every time you eat or drink. You have pain that does not get better with medicine. You cannot urinate or have bloody urine. You develop a skin rash. You have a fever. Get help right away if: You have trouble breathing. You have chest pain. You vomit blood. These symptoms may be an emergency. Get help right away. Call 911. Do not wait to see if the symptoms will go away. Do not drive yourself to the hospital. Summary After the procedure, it is common to have a sore throat, hoarseness, nausea, vomiting, or to feel weak, sleepy, or fatigue. For the time period you were told by your health care provider, do not drive or  use machinery. Get help right away if you have difficulty breathing, have chest pain, or vomit blood. These symptoms may be an emergency. This information is not intended to replace advice given to you by your health care provider. Make sure you discuss any questions you have with your health care provider. Document Revised: 10/08/2021 Document Reviewed: 10/08/2021 Elsevier Patient Education  2024 [employer]. How to Use Chlorhexidine Before Surgery  Chlorhexidine gluconate (CHG) is a germ-killing (antiseptic) solution that is used to clean the skin. It can get rid of the bacteria that normally live on the skin and can keep them away for about 24 hours. To clean your skin with CHG, you may be given: A CHG solution to use in the shower or as part of a sponge bath. A prepackaged cloth that contains CHG. Cleaning your skin with CHG may help lower the risk for infection: While you are staying in the intensive care unit of the hospital. If you have a vascular access, such as a central line, to provide short-term or long-term access to your veins. If you have a catheter to drain urine from your bladder. If you are on a ventilator. A ventilator is a machine that helps you breathe by moving air in and out of your lungs. After surgery. What are the risks? Risks of using CHG include: A skin reaction. Hearing loss, if CHG gets in your ears and you have a perforated eardrum. Eye injury, if CHG gets in your eyes and is not rinsed out. The CHG product catching fire. Make sure that you avoid smoking and flames after applying CHG to your skin. Do not use CHG: If you have a chlorhexidine allergy or have previously reacted to chlorhexidine. On babies younger than 3 months of age. How to use CHG solution Use CHG only as told by your health care provider, and follow the instructions on the label. Use the full amount of CHG as directed. Usually, this is one bottle. During a shower Follow these steps when  using CHG solution during a shower (unless your health care provider gives you different instructions): Start the shower. Use your normal soap and shampoo to wash your face and hair. Turn off the shower or move out of the shower stream. Pour the CHG onto a clean washcloth. Do not use any type of brush or rough-edged sponge. Starting at your neck, lather your body down to your toes. Make sure you follow these instructions: If you will be having surgery, pay special attention to the part of your body where you will be having surgery. Scrub this area for at least 1 minute. Do not use CHG on your head or face. If the solution gets into your ears or eyes, rinse them well with water. Avoid your genital area. Avoid any areas of skin that have broken skin, cuts, or scrapes. Scrub your back and under your arms. Make sure to wash skin folds. Let the lather sit on your skin for 1-2 minutes or as long as told by your health care provider. Thoroughly rinse your entire body in the shower. Make sure that all body creases and crevices are rinsed well. Dry off with a clean towel. Do not put any substances on your body afterward--such as powder, lotion, or perfume--unless you are told to do so by your health care provider. Only use lotions that are recommended by the manufacturer. Put on clean clothes or pajamas. If it is the night before your surgery, sleep in clean sheets.  During a sponge bath Follow these steps when using CHG solution during a sponge bath (unless your health care provider gives you different instructions): Use your normal soap and shampoo to wash your face and hair. Pour the CHG onto a clean washcloth. Starting at your neck, lather your body down to your toes. Make sure you follow these instructions: If you will be having surgery, pay special attention to the part  of your body where you will be having surgery. Scrub this area for at least 1 minute. Do not use CHG on your head or face. If  the solution gets into your ears or eyes, rinse them well with water. Avoid your genital area. Avoid any areas of skin that have broken skin, cuts, or scrapes. Scrub your back and under your arms. Make sure to wash skin folds. Let the lather sit on your skin for 1-2 minutes or as long as told by your health care provider. Using a different clean, wet washcloth, thoroughly rinse your entire body. Make sure that all body creases and crevices are rinsed well. Dry off with a clean towel. Do not put any substances on your body afterward--such as powder, lotion, or perfume--unless you are told to do so by your health care provider. Only use lotions that are recommended by the manufacturer. Put on clean clothes or pajamas. If it is the night before your surgery, sleep in clean sheets. How to use CHG prepackaged cloths Only use CHG cloths as told by your health care provider, and follow the instructions on the label. Use the CHG cloth on clean, dry skin. Do not use the CHG cloth on your head or face unless your health care provider tells you to. When washing with the CHG cloth: Avoid your genital area. Avoid any areas of skin that have broken skin, cuts, or scrapes. Before surgery Follow these steps when using a CHG cloth to clean before surgery (unless your health care provider gives you different instructions): Using the CHG cloth, vigorously scrub the part of your body where you will be having surgery. Scrub using a back-and-forth motion for 3 minutes. The area on your body should be completely wet with CHG when you are done scrubbing. Do not rinse. Discard the cloth and let the area air-dry. Do not put any substances on the area afterward, such as powder, lotion, or perfume. Put on clean clothes or pajamas. If it is the night before your surgery, sleep in clean sheets.  For general bathing Follow these steps when using CHG cloths for general bathing (unless your health care provider gives you  different instructions). Use a separate CHG cloth for each area of your body. Make sure you wash between any folds of skin and between your fingers and toes. Wash your body in the following order, switching to a new cloth after each step: The front of your neck, shoulders, and chest. Both of your arms, under your arms, and your hands. Your stomach and groin area, avoiding the genitals. Your right leg and foot. Your left leg and foot. The back of your neck, your back, and your buttocks. Do not rinse. Discard the cloth and let the area air-dry. Do not put any substances on your body afterward--such as powder, lotion, or perfume--unless you are told to do so by your health care provider. Only use lotions that are recommended by the manufacturer. Put on clean clothes or pajamas. Contact a health care provider if: Your skin gets irritated after scrubbing. You have questions about using your solution or cloth. You swallow any chlorhexidine. Call your local poison control center ([phone] in the [place] Get help right away if: Your eyes itch badly, or they become very red or swollen. Your skin itches badly and is red or swollen. Your hearing changes. You have trouble seeing. You have swelling or tingling in your mouth or throat. You have trouble breathing. These symptoms may represent a serious  problem that is an emergency. Do not wait to see if the symptoms will go away. Get medical help right away. Call your local emergency services (911 in the [place] Do not drive yourself to the hospital. Summary Chlorhexidine gluconate (CHG) is a germ-killing (antiseptic) solution that is used to clean the skin. Cleaning your skin with CHG may help to lower your risk for infection. You may be given CHG to use for bathing. It may be in a bottle or in a prepackaged cloth to use on your skin. Carefully follow your health care provider's instructions and the instructions on the product label. Do not use CHG if  you have a chlorhexidine allergy. Contact your health care provider if your skin gets irritated after scrubbing. This information is not intended to replace advice given to you by your health care provider. Make sure you discuss any questions you have with your health care provider. Document Revised: 11/08/2021 Document Reviewed: 09/21/2020 Elsevier Patient Education  2023 [employer].

## 2023-06-20 ENCOUNTER — Encounter (HOSPITAL_COMMUNITY)
Admission: RE | Admit: 2023-06-20 | Discharge: 2023-06-20 | Disposition: A | Discharge status: Home / Self Care | Payer: Medicare Other | Source: Ambulatory Visit | Attending: Urology | Admitting: Urology

## 2023-06-20 ENCOUNTER — Encounter (HOSPITAL_COMMUNITY): Payer: Self-pay

## 2023-06-20 VITALS — BP 111/70 | HR 93 | Temp 97.8°F | Resp 18 | Ht 67.0 in | Wt 164.9 lb

## 2023-06-20 DIAGNOSIS — Z01812 Encounter for preprocedural laboratory examination: Secondary | ICD-10-CM | POA: Diagnosis present

## 2023-06-20 DIAGNOSIS — Z01818 Encounter for other preprocedural examination: Secondary | ICD-10-CM

## 2023-06-20 HISTORY — DX: Bronchitis, not specified as acute or chronic: J40

## 2023-06-20 LAB — CBC WITH DIFFERENTIAL/PLATELET
Abs Immature Granulocytes: 0.01 10*3/uL (ref 0.00–0.07)
Basophils Absolute: 0 10*3/uL (ref 0.0–0.1)
Basophils Relative: 1 %
Eosinophils Absolute: 0.2 10*3/uL (ref 0.0–0.5)
Eosinophils Relative: 3 %
HCT: 40.7 % (ref 39.0–52.0)
Hemoglobin: 13.1 g/dL (ref 13.0–17.0)
Immature Granulocytes: 0 %
Lymphocytes Relative: 23 %
Lymphs Abs: 1.5 10*3/uL (ref 0.7–4.0)
MCH: 30.7 pg (ref 26.0–34.0)
MCHC: 32.2 g/dL (ref 30.0–36.0)
MCV: 95.3 fL (ref 80.0–100.0)
Monocytes Absolute: 0.5 10*3/uL (ref 0.1–1.0)
Monocytes Relative: 7 %
Neutro Abs: 4.5 10*3/uL (ref 1.7–7.7)
Neutrophils Relative %: 66 %
Platelets: 288 10*3/uL (ref 150–400)
RBC: 4.27 MIL/uL (ref 4.22–5.81)
RDW: 14 % (ref 11.5–15.5)
WBC: 6.7 10*3/uL (ref 4.0–10.5)
nRBC: 0 % (ref 0.0–0.2)

## 2023-06-26 ENCOUNTER — Encounter (HOSPITAL_COMMUNITY): Payer: Self-pay | Admitting: Urology

## 2023-06-26 ENCOUNTER — Other Ambulatory Visit: Payer: Self-pay

## 2023-06-26 ENCOUNTER — Ambulatory Visit (HOSPITAL_COMMUNITY): Payer: BC Managed Care – PPO | Admitting: Anesthesiology

## 2023-06-26 ENCOUNTER — Observation Stay (HOSPITAL_COMMUNITY)
Admission: RE | Admit: 2023-06-26 | Discharge: 2023-06-27 | Disposition: A | Discharge status: Home / Self Care | Payer: BC Managed Care – PPO | Attending: Urology | Admitting: Urology

## 2023-06-26 ENCOUNTER — Encounter (HOSPITAL_COMMUNITY)
Admission: RE | Disposition: A | Discharge status: Home / Self Care | Payer: Self-pay | Source: Home / Self Care | Attending: Urology

## 2023-06-26 DIAGNOSIS — Z23 Encounter for immunization: Secondary | ICD-10-CM | POA: Insufficient documentation

## 2023-06-26 DIAGNOSIS — N138 Other obstructive and reflux uropathy: Secondary | ICD-10-CM | POA: Diagnosis not present

## 2023-06-26 DIAGNOSIS — N400 Enlarged prostate without lower urinary tract symptoms: Principal | ICD-10-CM | POA: Diagnosis present

## 2023-06-26 DIAGNOSIS — N401 Enlarged prostate with lower urinary tract symptoms: Principal | ICD-10-CM | POA: Insufficient documentation

## 2023-06-26 HISTORY — PX: TRANSURETHRAL RESECTION OF PROSTATE: SHX73

## 2023-06-26 HISTORY — PX: CYSTOSCOPY: SHX5120

## 2023-06-26 LAB — BASIC METABOLIC PANEL
Anion gap: 6 (ref 5–15)
BUN: 20 mg/dL (ref 8–23)
CO2: 25 mmol/L (ref 22–32)
Calcium: 8.1 mg/dL — ABNORMAL LOW (ref 8.9–10.3)
Chloride: 107 mmol/L (ref 98–111)
Creatinine, Ser: 0.74 mg/dL (ref 0.61–1.24)
GFR, Estimated: >60 mL/min (ref >60)
Glucose, Bld: 131 mg/dL — ABNORMAL HIGH (ref 70–99)
Potassium: 3.5 mmol/L (ref 3.5–5.1)
Sodium: 138 mmol/L (ref 135–145)

## 2023-06-26 LAB — CBC
HCT: 34.5 % — ABNORMAL LOW (ref 39.0–52.0)
Hemoglobin: 11 g/dL — ABNORMAL LOW (ref 13.0–17.0)
MCH: 31.6 pg (ref 26.0–34.0)
MCHC: 31.9 g/dL (ref 30.0–36.0)
MCV: 99.1 fL (ref 80.0–100.0)
Platelets: 246 10*3/uL (ref 150–400)
RBC: 3.48 MIL/uL — ABNORMAL LOW (ref 4.22–5.81)
RDW: 14.3 % (ref 11.5–15.5)
WBC: 13.2 10*3/uL — ABNORMAL HIGH (ref 4.0–10.5)
nRBC: 0 % (ref 0.0–0.2)

## 2023-06-26 SURGERY — CYSTOSCOPY
Anesthesia: General | Site: Penis

## 2023-06-26 MED ORDER — ZOLPIDEM TARTRATE 5 MG PO TABS: 5.0000 mg | ORAL_TABLET | Freq: Every evening | ORAL | Status: DC | PRN

## 2023-06-26 MED ORDER — DEXAMETHASONE SODIUM PHOSPHATE 10 MG/ML IJ SOLN
INTRAMUSCULAR | Status: DC | PRN
  Administered 2023-06-26: 10 mg via INTRAVENOUS

## 2023-06-26 MED ORDER — ROCURONIUM BROMIDE 10 MG/ML (PF) SYRINGE
PREFILLED_SYRINGE | INTRAVENOUS | Status: AC
  Filled 2023-06-26: qty 10

## 2023-06-26 MED ORDER — OXYCODONE HCL 5 MG PO TABS: 5.0000 mg | ORAL_TABLET | ORAL | Status: DC | PRN

## 2023-06-26 MED ORDER — OXYCODONE HCL 5 MG PO TABS: 5.0000 mg | ORAL_TABLET | Freq: Once | ORAL | Status: DC | PRN

## 2023-06-26 MED ORDER — PNEUMOCOCCAL 20-VAL CONJ VACC 0.5 ML IM SUSY
0.5000 mL | PREFILLED_SYRINGE | INTRAMUSCULAR | Status: AC
  Administered 2023-06-27: 0.5 mL via INTRAMUSCULAR
  Filled 2023-06-26: qty 0.5

## 2023-06-26 MED ORDER — HYDROMORPHONE HCL 1 MG/ML IJ SOLN
0.2500 mg | INTRAMUSCULAR | Status: DC | PRN
Start: 2023-06-26 — End: 2023-06-26

## 2023-06-26 MED ORDER — FENTANYL CITRATE (PF) 100 MCG/2ML IJ SOLN
INTRAMUSCULAR | Status: AC
  Filled 2023-06-26: qty 2

## 2023-06-26 MED ORDER — LACTATED RINGERS IV SOLN: INTRAVENOUS | Status: DC | PRN

## 2023-06-26 MED ORDER — SODIUM CHLORIDE 0.9 % IR SOLN
3000.0000 mL | Status: DC
  Administered 2023-06-26 – 2023-06-27 (×4): 3000 mL

## 2023-06-26 MED ORDER — SODIUM CHLORIDE 0.9 % IR SOLN
Status: DC | PRN
  Administered 2023-06-26 (×10): 3000 mL

## 2023-06-26 MED ORDER — ORAL CARE MOUTH RINSE
15.0000 mL | Freq: Once | OROMUCOSAL | Status: AC
Start: 2023-06-26 — End: 2023-06-26

## 2023-06-26 MED ORDER — PHENYLEPHRINE 80 MCG/ML (10ML) SYRINGE FOR IV PUSH (FOR BLOOD PRESSURE SUPPORT)
PREFILLED_SYRINGE | INTRAVENOUS | Status: AC
Start: 2023-06-26 — End: ?
  Filled 2023-06-26: qty 10

## 2023-06-26 MED ORDER — ONDANSETRON HCL 4 MG/2ML IJ SOLN: 4.0000 mg | Freq: Once | INTRAMUSCULAR | Status: DC | PRN

## 2023-06-26 MED ORDER — OXYBUTYNIN CHLORIDE 5 MG PO TABS: 5.0000 mg | ORAL_TABLET | Freq: Three times a day (TID) | ORAL | Status: DC | PRN

## 2023-06-26 MED ORDER — PHENYLEPHRINE HCL (PRESSORS) 10 MG/ML IV SOLN
INTRAVENOUS | Status: DC | PRN
  Administered 2023-06-26: 40 ug via INTRAVENOUS
  Administered 2023-06-26: 80 ug via INTRAVENOUS
  Administered 2023-06-26: 40 ug via INTRAVENOUS
  Administered 2023-06-26: 80 ug via INTRAVENOUS
  Administered 2023-06-26: 40 ug via INTRAVENOUS

## 2023-06-26 MED ORDER — ROCURONIUM BROMIDE 100 MG/10ML IV SOLN
INTRAVENOUS | Status: DC | PRN
  Administered 2023-06-26: 10 mg via INTRAVENOUS
  Administered 2023-06-26: 50 mg via INTRAVENOUS

## 2023-06-26 MED ORDER — LIDOCAINE HCL (CARDIAC) PF 100 MG/5ML IV SOSY
PREFILLED_SYRINGE | INTRAVENOUS | Status: DC | PRN
  Administered 2023-06-26: 60 mg via INTRAVENOUS

## 2023-06-26 MED ORDER — CHLORHEXIDINE GLUCONATE 0.12 % MT SOLN
15.0000 mL | Freq: Once | OROMUCOSAL | Status: AC
  Administered 2023-06-26: 15 mL via OROMUCOSAL

## 2023-06-26 MED ORDER — ALBUTEROL SULFATE (2.5 MG/3ML) 0.083% IN NEBU: 3.0000 mL | INHALATION_SOLUTION | Freq: Four times a day (QID) | RESPIRATORY_TRACT | Status: DC | PRN

## 2023-06-26 MED ORDER — OXYCODONE HCL 5 MG/5ML PO SOLN
5.0000 mg | Freq: Once | ORAL | Status: DC | PRN
Start: 2023-06-26 — End: 2023-06-26

## 2023-06-26 MED ORDER — CEFAZOLIN SODIUM-DEXTROSE 2-4 GM/100ML-% IV SOLN
2.0000 g | INTRAVENOUS | Status: AC
  Administered 2023-06-26: 2 g via INTRAVENOUS

## 2023-06-26 MED ORDER — CEFAZOLIN SODIUM-DEXTROSE 2-4 GM/100ML-% IV SOLN
INTRAVENOUS | Status: AC
  Filled 2023-06-26: qty 100

## 2023-06-26 MED ORDER — ONDANSETRON HCL 4 MG/2ML IJ SOLN
INTRAMUSCULAR | Status: DC | PRN
  Administered 2023-06-26: 4 mg via INTRAVENOUS

## 2023-06-26 MED ORDER — PROPOFOL 10 MG/ML IV BOLUS
INTRAVENOUS | Status: AC
  Filled 2023-06-26: qty 20

## 2023-06-26 MED ORDER — WATER FOR IRRIGATION, STERILE IR SOLN
Status: DC | PRN
  Administered 2023-06-26: 1000 mL

## 2023-06-26 MED ORDER — ONDANSETRON HCL 4 MG/2ML IJ SOLN: 4.0000 mg | INTRAMUSCULAR | Status: DC | PRN

## 2023-06-26 MED ORDER — ACETAMINOPHEN 325 MG PO TABS: 650.0000 mg | ORAL_TABLET | ORAL | Status: DC | PRN

## 2023-06-26 MED ORDER — FENTANYL CITRATE (PF) 100 MCG/2ML IJ SOLN
INTRAMUSCULAR | Status: DC | PRN
  Administered 2023-06-26: 50 ug via INTRAVENOUS
  Administered 2023-06-26: 25 ug via INTRAVENOUS
  Administered 2023-06-26 (×2): 50 ug via INTRAVENOUS
  Administered 2023-06-26: 25 ug via INTRAVENOUS

## 2023-06-26 MED ORDER — HYDROMORPHONE HCL 1 MG/ML IJ SOLN
0.5000 mg | INTRAMUSCULAR | Status: DC | PRN
Start: 2023-06-26 — End: 2023-06-27

## 2023-06-26 MED ORDER — PROPOFOL 10 MG/ML IV BOLUS
INTRAVENOUS | Status: DC | PRN
  Administered 2023-06-26: 30 mg via INTRAVENOUS
  Administered 2023-06-26: 140 mg via INTRAVENOUS
  Administered 2023-06-26: 20 mg via INTRAVENOUS

## 2023-06-26 MED ORDER — CEFAZOLIN SODIUM-DEXTROSE 2-4 GM/100ML-% IV SOLN
2.0000 g | Freq: Three times a day (TID) | INTRAVENOUS | Status: AC
  Administered 2023-06-26 – 2023-06-27 (×2): 2 g via INTRAVENOUS
  Filled 2023-06-26 (×2): qty 100

## 2023-06-26 MED ORDER — ONDANSETRON HCL 4 MG/2ML IJ SOLN
INTRAMUSCULAR | Status: AC
  Filled 2023-06-26: qty 4

## 2023-06-26 MED ORDER — DIPHENHYDRAMINE HCL 12.5 MG/5ML PO ELIX: 12.5000 mg | ORAL_SOLUTION | Freq: Four times a day (QID) | ORAL | Status: DC | PRN

## 2023-06-26 MED ORDER — SUGAMMADEX SODIUM 200 MG/2ML IV SOLN
INTRAVENOUS | Status: DC | PRN
  Administered 2023-06-26: 200 mg via INTRAVENOUS

## 2023-06-26 MED ORDER — SENNOSIDES-DOCUSATE SODIUM 8.6-50 MG PO TABS
2.0000 | ORAL_TABLET | Freq: Every day | ORAL | Status: DC
  Administered 2023-06-26: 2 via ORAL
  Filled 2023-06-26: qty 2

## 2023-06-26 MED ORDER — DEXAMETHASONE SODIUM PHOSPHATE 10 MG/ML IJ SOLN
INTRAMUSCULAR | Status: AC
  Filled 2023-06-26: qty 2

## 2023-06-26 MED ORDER — DIPHENHYDRAMINE HCL 50 MG/ML IJ SOLN: 12.5000 mg | Freq: Four times a day (QID) | INTRAMUSCULAR | Status: DC | PRN

## 2023-06-26 MED ORDER — LIDOCAINE HCL (PF) 2 % IJ SOLN
INTRAMUSCULAR | Status: AC
  Filled 2023-06-26: qty 5

## 2023-06-26 MED ORDER — EPHEDRINE 5 MG/ML INJ
INTRAVENOUS | Status: AC
  Filled 2023-06-26: qty 5

## 2023-06-26 MED ORDER — DEXTROSE-SODIUM CHLORIDE 5-0.45 % IV SOLN: INTRAVENOUS | Status: DC

## 2023-06-26 SURGICAL SUPPLY — 24 items
BAG DRAIN URO TABLE W/ADPT NS (BAG) ×1 IMPLANT
BAG HAMPER (MISCELLANEOUS) ×1 IMPLANT
BAG URINE DRAIN TURP 4L (OSTOMY) ×1 IMPLANT
CATH FOLEY 3WAY 30CC 22F (CATHETERS) ×1 IMPLANT
CLOTH BEACON ORANGE TIMEOUT ST (SAFETY) ×1 IMPLANT
ELECT REM PT RETURN 9FT ADLT (ELECTROSURGICAL) ×1
ELECTRODE REM PT RTRN 9FT ADLT (ELECTROSURGICAL) ×1 IMPLANT
GLOVE BIO SURGEON STRL SZ8 (GLOVE) ×1 IMPLANT
GLOVE BIOGEL PI IND STRL 7.0 (GLOVE) ×2 IMPLANT
GLOVE ECLIPSE 6.5 STRL STRAW (GLOVE) IMPLANT
GOWN STRL REUS W/TWL LRG LVL3 (GOWN DISPOSABLE) ×2 IMPLANT
GOWN STRL REUS W/TWL XL LVL3 (GOWN DISPOSABLE) ×1 IMPLANT
IV NS IRRIG 3000ML ARTHROMATIC (IV SOLUTION) ×4 IMPLANT
KIT TURNOVER CYSTO (KITS) ×1 IMPLANT
LOOP CUT BIPOLAR 24F LRG (ELECTROSURGICAL) ×1 IMPLANT
MANIFOLD NEPTUNE II (INSTRUMENTS) ×1 IMPLANT
PACK CYSTO (CUSTOM PROCEDURE TRAY) ×1 IMPLANT
PAD ARMBOARD 7.5X6 YLW CONV (MISCELLANEOUS) ×1 IMPLANT
POSITIONER HEAD 8X9X4 ADT (SOFTGOODS) ×1 IMPLANT
SYR 30ML LL (SYRINGE) ×1 IMPLANT
SYR TOOMEY IRRIG 70ML (MISCELLANEOUS) ×1
SYRINGE TOOMEY IRRIG 70ML (MISCELLANEOUS) ×1 IMPLANT
TOWEL OR 17X26 4PK STRL BLUE (TOWEL DISPOSABLE) ×1 IMPLANT
WATER STERILE IRR 500ML POUR (IV SOLUTION) ×1 IMPLANT

## 2023-06-26 NOTE — Anesthesia Postprocedure Evaluation (Signed)
Anesthesia Post Note  Patient: Eric Andrade  Procedure(s) Performed: CYSTOSCOPY (Penis) TRANSURETHRAL RESECTION OF THE PROSTATE (TURP) (Penis)  Patient location during evaluation: PACU Anesthesia Type: General Level of consciousness: awake and alert Pain management: pain level controlled Vital Signs Assessment: post-procedure vital signs reviewed and stable Respiratory status: spontaneous breathing, nonlabored ventilation, respiratory function stable and patient connected to nasal cannula oxygen Cardiovascular status: blood pressure returned to baseline and stable Postop Assessment: no apparent nausea or vomiting Anesthetic complications: no   There were no known notable events for this encounter.   Last Vitals:  Vitals:   06/26/23 1225 06/26/23 1239  BP: (!) 96/56 109/67  Pulse: 77 84  Resp: 16 16  Temp: 36.7 C 36.6 C  SpO2: 96% 92%    Last Pain:  Vitals:   06/26/23 1239  TempSrc: Oral  PainSc:                  Gaetano Hawthorne

## 2023-06-26 NOTE — Op Note (Signed)
Preoperative diagnosis: BPH  Postoperative diagnosis: BPH  Procedure: 1 cystoscopy 2. Transurethral resection of the prostate  Attending: [name]  Anesthesia: General  Estimated blood loss: Minimal  Drains: 22 French foley  Specimens: 1. Prostate Chips  Antibiotics: Rocephin  Findings: Trilobar prostate enlargement. Ureteral orifices in normal anatomic location.   Indications: Patient is a 67 year old male with a history of BPH and urinary retention requiring indwelling foley.  After discussing treatment options, they decided proceed with transurethral resection of the prostate.  Procedure in detail: The patient was brought to the operating room and a brief timeout was done to ensure correct patient, correct procedure, correct site.  General anesthesia was administered patient was placed in dorsal lithotomy position.  Their genitalia was then prepped and draped in usual sterile fashion.  A rigid 22 French cystoscope was passed in the urethra and the bladder.  Bladder was inspected and we noted no masses or lesions.  the ureteral orifices were in the normal orthotopic locations. removed the cystoscope and placed a resectoscope into the bladder. We then turned our attention to the prostate resection. Using the bipolar resectoscope we resected the median lobe first from the bladder neck to the verumontanum. We then started at the 12 oclock position on the left lobe and resection to the 6 o'clock position from the bladder neck to the verumontanum. We then did the same resection of the right lobe. Once the resection was complete we then cauterized individual bleeders. We then removed the prostate chips and sent them for pathology.  We then re-inspected the prostatic fossa and found no residual bleeding.  the bladder was then drained, a 22 French foley was placed and this concluded the procedure which was well tolerated by patient.  Complications: None  Condition: Stable, extubated,  transferred to PACU  Plan: Patient is admitted overnight with continuous bladder irrigation. If their urine is clear tomorrow they will be discharged home and followup in 5 days for foley catheter removal and pathology discussion.

## 2023-06-26 NOTE — Plan of Care (Signed)

## 2023-06-26 NOTE — Transfer of Care (Signed)
Immediate Anesthesia Transfer of Care Note  Patient: Eric Andrade  Procedure(s) Performed: CYSTOSCOPY (Penis) TRANSURETHRAL RESECTION OF THE PROSTATE (TURP) (Penis)  Patient Location: PACU  Anesthesia Type:General  Level of Consciousness: awake and patient cooperative  Airway & Oxygen Therapy: Patient Spontanous Breathing and Patient connected to nasal cannula oxygen  Post-op Assessment: Report given to RN and Post -op Vital signs reviewed and stable  Post vital signs: Reviewed and stable  Last Vitals:  Vitals Value Taken Time  BP 95/59 06/26/23 1109  Temp 98.6 06/26/23  1111  Pulse 40 06/26/23 1112  Resp 16 06/26/23 1112  SpO2 92 % 06/26/23 1112  Vitals shown include unfiled device data.  Last Pain:  Vitals:   06/26/23 0907  TempSrc: Oral  PainSc: 0-No pain      Patients Stated Pain Goal: 5 (06/26/23 0907)  Complications: No notable events documented.

## 2023-06-26 NOTE — H&P (Signed)
Urology Admission H&P  Chief Complaint: urinary retention  History of Present Illness: [name] is a 67yo with a history of BPH here for TURP. He has a history of elevated PSa and multiple negative prostate biopsies. He has had urinary retention requiring foley catheter for the past 4 months.   Past Medical History:  Diagnosis Date   Arthritis    BPH (benign prostatic hyperplasia)    Bronchitis    Past Surgical History:  Procedure Laterality Date   CIRCUMCISION     COLONOSCOPY WITH PROPOFOL N/A 05/30/2022   Procedure: COLONOSCOPY WITH PROPOFOL;  Surgeon: Lanelle Bal, DO;  [facility];  Service: Endoscopy;  Laterality: N/A;  7:30 am   HERNIA REPAIR     umbilical   HYDROCELE EXCISION Left    LEG SURGERY Bilateral    POLYPECTOMY  05/30/2022   Procedure: POLYPECTOMY;  Surgeon: Lanelle Bal, DO;  [facility];  Service: Endoscopy;;   WRIST SURGERY Right     Home Medications:  Current Facility-Administered Medications  Medication Dose Route Frequency Provider Last Rate Last Admin   ceFAZolin (ANCEF) IVPB 2g/100 mL premix  2 g Intravenous 30 min Pre-Op Janifer Gieselman, Mardene Celeste, MD       chlorhexidine (PERIDEX) 0.12 % solution 15 mL  15 mL Mouth/Throat Once Ronelle Nigh, MD       Or   Oral care mouth rinse  15 mL Mouth Rinse Once Ronelle Nigh, MD       Allergies: No Known Allergies  Family History  Problem Relation Age of Onset   Colon cancer Brother        in his late 50s/early 60s   Social History:  reports that he has never smoked. He has never used smokeless tobacco. He reports current alcohol use. He reports that he does not currently use drugs.  Review of Systems  Genitourinary:  Positive for difficulty urinating.  All other systems reviewed and are negative.   Physical Exam:  Vital signs in last 24 hours: Temp:  [97.6 F (36.4 C)] 97.6 F (36.4 C) (12/02 0907) Pulse Rate:  [79] 79 (12/02 0907) BP: (129)/(74) 129/74 (12/02  0907) SpO2:  [20 %] 20 % (12/02 0907) Physical Exam Constitutional:      Appearance: Normal appearance.  HENT:     Head: Normocephalic and atraumatic.     Nose: Nose normal. No congestion.  Eyes:     Extraocular Movements: Extraocular movements intact.     Pupils: Pupils are equal, round, and reactive to light.  Cardiovascular:     Rate and Rhythm: Normal rate and regular rhythm.  Pulmonary:     Effort: Pulmonary effort is normal. No respiratory distress.  Abdominal:     General: Abdomen is flat. There is no distension.  Musculoskeletal:        General: No swelling. Normal range of motion.     Cervical back: Normal range of motion and neck supple.  Skin:    General: Skin is warm and dry.  Neurological:     General: No focal deficit present.     Mental Status: He is alert and oriented to person, place, and time.  Psychiatric:        Mood and Affect: Mood normal.        Behavior: Behavior normal.        Thought Content: Thought content normal.        Judgment: Judgment normal.     Laboratory Data:  No results  found for this or any previous visit (from the past 24 hour(s)). No results found for this or any previous visit (from the past 240 hour(s)). Creatinine: No results for input(s): "CREATININE" in the last 168 hours. Baseline Creatinine:   Impression/Assessment:  67yo with BPH and urinary retention  Plan:  We discussed the management of his BPH including continued medical therapy, Rezum, Urolift, TURP and simple prostatectomy. After discussing the options the patient has elected to proceed with TURP. Risks/benefits/alternatives discussed.   [name] 06/26/2023, 9:13 AM

## 2023-06-26 NOTE — Anesthesia Procedure Notes (Signed)
Procedure Name: Intubation Date/Time: 06/26/2023 9:34 AM  Performed by: Franco Nones, CRNAPre-anesthesia Checklist: Patient identified, Patient being monitored, Timeout performed, Emergency Drugs available and Suction available Patient Re-evaluated:Patient Re-evaluated prior to induction Oxygen Delivery Method: Circle System Utilized Preoxygenation: Pre-oxygenation with 100% oxygen Induction Type: IV induction Ventilation: Mask ventilation without difficulty Laryngoscope Size: Miller and 2 Grade View: Grade I Tube type: Oral Tube size: 7.5 mm Number of attempts: 1 Airway Equipment and Method: Stylet Placement Confirmation: ETT inserted through vocal cords under direct vision, positive ETCO2 and breath sounds checked- equal and bilateral Secured at: 22 cm Tube secured with: Tape Dental Injury: Teeth and Oropharynx as per pre-operative assessment

## 2023-06-26 NOTE — Anesthesia Preprocedure Evaluation (Signed)
Anesthesia Evaluation  Patient identified by MRN, date of birth, ID band Patient awake    Reviewed: Allergy & Precautions, H&P , NPO status , Patient's Chart, lab work & pertinent test results  History of Anesthesia Complications (+) history of anesthetic complications (aspiration during colonoscopy)  Airway Mallampati: I  TM Distance: >3 FB Neck ROM: Full    Dental  (+) Edentulous Upper, Edentulous Lower   Pulmonary  History aspiration during colonscopy   Pulmonary exam normal breath sounds clear to auscultation       Cardiovascular Exercise Tolerance: Good negative cardio ROS Normal cardiovascular exam Rhythm:Regular Rate:Normal     Neuro/Psych negative neurological ROS  negative psych ROS   GI/Hepatic negative GI ROS, Neg liver ROS,,,  Endo/Other  negative endocrine ROS    Renal/GU negative Renal ROS Bladder dysfunction (BPH)      Musculoskeletal  (+) Arthritis , Osteoarthritis,    Abdominal Normal abdominal exam  (+)   Peds negative pediatric ROS (+)  Hematology negative hematology ROS (+)   Anesthesia Other Findings   Reproductive/Obstetrics negative OB ROS                             Anesthesia Physical Anesthesia Plan  ASA: 2  Anesthesia Plan: General   Post-op Pain Management: Dilaudid IV   Induction: Intravenous  PONV Risk Score and Plan: 2 and Ondansetron, Dexamethasone and Midazolam  Airway Management Planned: Oral ETT  Additional Equipment: None  Intra-op Plan:   Post-operative Plan:   Informed Consent: I have reviewed the patients History and Physical, chart, labs and discussed the procedure including the risks, benefits and alternatives for the proposed anesthesia with the patient or authorized representative who has indicated his/her understanding and acceptance.     Dental advisory given  Plan Discussed with: CRNA and Surgeon  Anesthesia Plan  Comments:        Anesthesia Quick Evaluation

## 2023-06-26 NOTE — Plan of Care (Signed)
  Problem: Activity: Goal: Risk for activity intolerance will decrease Outcome: Progressing   Problem: Coping: Goal: Level of anxiety will decrease Outcome: Progressing   Problem: Pain Management: Goal: General experience of comfort will improve Outcome: Progressing

## 2023-06-27 ENCOUNTER — Encounter: Payer: BC Managed Care – PPO | Admitting: Urology

## 2023-06-27 DIAGNOSIS — N401 Enlarged prostate with lower urinary tract symptoms: Secondary | ICD-10-CM | POA: Diagnosis not present

## 2023-06-27 LAB — CBC
HCT: 29.1 % — ABNORMAL LOW (ref 39.0–52.0)
Hemoglobin: 9.6 g/dL — ABNORMAL LOW (ref 13.0–17.0)
MCH: 32.4 pg (ref 26.0–34.0)
MCHC: 33 g/dL (ref 30.0–36.0)
MCV: 98.3 fL (ref 80.0–100.0)
Platelets: 246 K/uL (ref 150–400)
RBC: 2.96 MIL/uL — ABNORMAL LOW (ref 4.22–5.81)
RDW: 14.3 % (ref 11.5–15.5)
WBC: 11.6 K/uL — ABNORMAL HIGH (ref 4.0–10.5)
nRBC: 0 % (ref 0.0–0.2)

## 2023-06-27 LAB — BASIC METABOLIC PANEL
Anion gap: 10 (ref 5–15)
BUN: 16 mg/dL (ref 8–23)
CO2: 25 mmol/L (ref 22–32)
Calcium: 8.3 mg/dL — ABNORMAL LOW (ref 8.9–10.3)
Chloride: 102 mmol/L (ref 98–111)
Creatinine, Ser: 0.63 mg/dL (ref 0.61–1.24)
GFR, Estimated: >60 mL/min (ref >60)
Glucose, Bld: 133 mg/dL — ABNORMAL HIGH (ref 70–99)
Potassium: 3.1 mmol/L — ABNORMAL LOW (ref 3.5–5.1)
Sodium: 137 mmol/L (ref 135–145)

## 2023-06-27 LAB — SURGICAL PATHOLOGY

## 2023-06-27 MED ORDER — SENNA 8.6 MG PO TABS: 1.0000 | ORAL_TABLET | Freq: Every day | ORAL | 0 refills | Status: AC

## 2023-06-27 MED ORDER — CHLORHEXIDINE GLUCONATE CLOTH 2 % EX PADS
6.0000 | MEDICATED_PAD | Freq: Every day | CUTANEOUS | Status: DC
  Administered 2023-06-27: 6 via TOPICAL

## 2023-06-27 MED ORDER — OXYCODONE-ACETAMINOPHEN 5-325 MG PO TABS: 1.0000 | ORAL_TABLET | ORAL | 0 refills | Status: DC | PRN

## 2023-06-27 NOTE — Progress Notes (Signed)
Transition of Care Department [facility]) has reviewed patient and no other TOC needs have been identified at this time. We will continue to monitor patient advancement through interdisciplinary progression rounds. If new patient transition needs arise, please place a TOC consult.   06/27/23 0757  TOC Brief Assessment  Insurance and Status Reviewed  Patient has primary care physician Yes  Home environment has been reviewed Lives alone.  Prior level of function: Independent.  Prior/Current Home Services No current home services  Social Determinants of Health Reivew SDOH reviewed no interventions necessary  Readmission risk has been reviewed Yes  Transition of care needs no transition of care needs at this time

## 2023-06-29 ENCOUNTER — Encounter (HOSPITAL_COMMUNITY): Payer: Self-pay | Admitting: Urology

## 2023-07-03 ENCOUNTER — Encounter: Payer: BC Managed Care – PPO | Admitting: Urology

## 2023-07-03 NOTE — Progress Notes (Signed)
History of Present Illness: Eric Andrade comes in today for follow-up of TURP.  That procedure was performed on the second of this month by Dr. [name].  40 g of tissue resected, all benign.  Past Medical History:  Diagnosis Date   Arthritis    BPH (benign prostatic hyperplasia)    Bronchitis     Past Surgical History:  Procedure Laterality Date   CIRCUMCISION     COLONOSCOPY WITH PROPOFOL N/A 05/30/2022   Procedure: COLONOSCOPY WITH PROPOFOL;  Surgeon: Lanelle Bal, DO;  [facility];  Service: Endoscopy;  Laterality: N/A;  7:30 am   CYSTOSCOPY N/A 06/26/2023   Procedure: CYSTOSCOPY;  Surgeon: Malen Gauze, MD;  Location: [facility];  Service: Urology;  Laterality: N/A;   HERNIA REPAIR     umbilical   HYDROCELE EXCISION Left    LEG SURGERY Bilateral    POLYPECTOMY  05/30/2022   Procedure: POLYPECTOMY;  Surgeon: Lanelle Bal, DO;  [facility];  Service: Endoscopy;;   TRANSURETHRAL RESECTION OF PROSTATE N/A 06/26/2023   Procedure: TRANSURETHRAL RESECTION OF THE PROSTATE (TURP);  Surgeon: Malen Gauze, MD;  Location: [facility];  Service: Urology;  Laterality: N/A;   WRIST SURGERY Right     Home Medications:  Allergies as of 07/04/2023   No Known Allergies      Medication List        Accurate as of July 03, 2023 12:02 PM. If you have any questions, ask your nurse or doctor.          acetaminophen 650 MG CR tablet Commonly known as: TYLENOL Take 1,300 mg by mouth every 8 (eight) hours as needed for pain.   albuterol 108 (90 Base) MCG/ACT inhaler Commonly known as: VENTOLIN HFA Inhale 2 puffs into the lungs every 6 (six) hours as needed for wheezing or shortness of breath.   diclofenac 75 MG EC tablet Commonly known as: VOLTAREN Take 75 mg by mouth 2 (two) times daily as needed for moderate pain (pain score 4-6).   diphenhydrAMINE 25 MG tablet Commonly known as: BENADRYL Take 25 mg by mouth daily as needed for allergies  (Equate Brand).   oxyCODONE-acetaminophen 5-325 MG tablet Commonly known as: Percocet Take 1 tablet by mouth every 4 (four) hours as needed for severe pain (pain score 7-10).   senna 8.6 MG Tabs tablet Commonly known as: SENOKOT Take 1 tablet (8.6 mg total) by mouth daily.   sildenafil 100 MG tablet Commonly known as: VIAGRA TAKE 1 TABLET BY MOUTH ONCE DAILY AS NEEDED FOR ERECTILE DYSFUNCTION        Allergies: No Known Allergies  Family History  Problem Relation Age of Onset   Colon cancer Brother        in his late 50s/early 49s    Social History:  reports that he has never smoked. He has never used smokeless tobacco. He reports current alcohol use. He reports that he does not currently use drugs.  ROS: A complete review of systems was performed.  All systems are negative except for pertinent findings as noted.  Physical Exam:  Vital signs in last 24 hours: There were no vitals taken for this visit. Constitutional:  Alert and oriented, No acute distress Cardiovascular: Regular rate  Respiratory: Normal respiratory effort GI: Abdomen is soft, nontender, nondistended, no abdominal masses. No CVAT.  Genitourinary: Normal male phallus, testes are descended bilaterally and non-tender and without masses, scrotum is normal in appearance without lesions or masses, perineum  is normal on inspection. Lymphatic: No lymphadenopathy Neurologic: Grossly intact, no focal deficits Psychiatric: Normal mood and affect  I have reviewed prior pt notes  I have reviewed urinalysis results  I have independently reviewed prior imaging  I have reviewed  patient pathology and PSA results  I have reviewed prior urine culture   Impression/Assessment:  ***  Plan:  ***

## 2023-07-04 ENCOUNTER — Ambulatory Visit: Payer: BC Managed Care – PPO | Admitting: Urology

## 2023-07-04 VITALS — BP 114/76 | HR 103

## 2023-07-04 DIAGNOSIS — Z87898 Personal history of other specified conditions: Secondary | ICD-10-CM

## 2023-07-04 DIAGNOSIS — Z09 Encounter for follow-up examination after completed treatment for conditions other than malignant neoplasm: Secondary | ICD-10-CM

## 2023-07-04 DIAGNOSIS — R339 Retention of urine, unspecified: Secondary | ICD-10-CM

## 2023-07-04 DIAGNOSIS — N401 Enlarged prostate with lower urinary tract symptoms: Secondary | ICD-10-CM

## 2023-07-04 MED ORDER — CIPROFLOXACIN HCL 500 MG PO TABS
500.0000 mg | ORAL_TABLET | Freq: Once | ORAL | Status: AC
  Administered 2023-07-04: 500 mg via ORAL

## 2023-07-04 MED ORDER — CEPHALEXIN 500 MG PO CAPS: 500.0000 mg | ORAL_CAPSULE | Freq: Two times a day (BID) | ORAL | 0 refills | Status: AC

## 2023-07-04 NOTE — Progress Notes (Addendum)
Fill and Pull Catheter Removal  Patient is present today for a catheter removal.  Patient was cleaned and prepped in a sterile fashion 75ml of sterile water/ saline was instilled into the bladder when the patient felt the urge to urinate. 30ml of water was then drained from the balloon.  A 22FR foley cath was removed from the bladder no complications were noted .  Patient as then given some time to void on their own.  Patient can void  on their own after some time.  Patient tolerated well.  Performed by: Gwendolyn Grant CMA  Follow up/ Additional notes: Follow up as scheduled

## 2023-07-04 NOTE — Discharge Summary (Signed)
Physician Discharge Summary  Patient ID: Eric Andrade MRN: [id number] DOB/AGE: [id number] y.o.  Admit date: 06/26/2023 Discharge date: 06/27/2023  Admission Diagnoses:  BPH (benign prostatic hyperplasia)  Discharge Diagnoses:  Principal Problem:   BPH (benign prostatic hyperplasia) Active Problems:   Benign prostatic hyperplasia with urinary obstruction   Past Medical History:  Diagnosis Date   Arthritis    BPH (benign prostatic hyperplasia)    Bronchitis     Surgeries: Procedure(s): CYSTOSCOPY TRANSURETHRAL RESECTION OF THE PROSTATE (TURP) on 06/26/2023   Consultants (if any):   Discharged Condition: Improved  Hospital Course: Eric Andrade is an 67 y.o. male who was admitted 06/26/2023 with a diagnosis of BPH (benign prostatic hyperplasia) and went to the operating room on 06/26/2023 and underwent the above named procedures.    He was given perioperative antibiotics:  Anti-infectives (From admission, onward)    Start     Dose/Rate Route Frequency Ordered Stop   06/26/23 1800  ceFAZolin (ANCEF) IVPB 2g/100 mL premix        2 g 200 mL/hr over 30 Minutes Intravenous Every 8 hours 06/26/23 1225 06/27/23 0320   06/26/23 0911  ceFAZolin (ANCEF) 2-4 GM/100ML-% IVPB       Note to Pharmacy: Eric Andrade: cabinet override      06/26/23 0911 06/26/23 0946   06/26/23 0909  ceFAZolin (ANCEF) IVPB 2g/100 mL premix        2 g 200 mL/hr over 30 Minutes Intravenous 30 min pre-op 06/26/23 0909 06/26/23 1000     .  He was given sequential compression devices, early ambulation for DVT prophylaxis.  He benefited maximally from the hospital stay and there were no complications.    Recent vital signs:  Vitals:   06/26/23 2300 06/27/23 0435  BP: 108/62 (!) 110/58  Pulse:  71  Resp:  20  Temp:  98.5 F (36.9 C)  SpO2:  95%    Recent laboratory studies:  Lab Results  Component Value Date   HGB 9.6 (L) 06/27/2023   HGB 11.0 (L) 06/26/2023   HGB 13.1 06/20/2023   Lab  Results  Component Value Date   WBC 11.6 (H) 06/27/2023   PLT 246 06/27/2023   No results found for: "INR" Lab Results  Component Value Date   NA 137 06/27/2023   K 3.1 (L) 06/27/2023   CL 102 06/27/2023   CO2 25 06/27/2023   BUN 16 06/27/2023   CREATININE 0.63 06/27/2023   GLUCOSE 133 (H) 06/27/2023    Discharge Medications:   Allergies as of 06/27/2023   No Known Allergies      Medication List     STOP taking these medications    sulfamethoxazole-trimethoprim 800-160 MG tablet Commonly known as: BACTRIM DS   tamsulosin 0.4 MG Caps capsule Commonly known as: FLOMAX       TAKE these medications    acetaminophen 650 MG CR tablet Commonly known as: TYLENOL Take 1,300 mg by mouth every 8 (eight) hours as needed for pain.   albuterol 108 (90 Base) MCG/ACT inhaler Commonly known as: VENTOLIN HFA Inhale 2 puffs into the lungs every 6 (six) hours as needed for wheezing or shortness of breath.   diclofenac 75 MG EC tablet Commonly known as: VOLTAREN Take 75 mg by mouth 2 (two) times daily as needed for moderate pain (pain score 4-6).   diphenhydrAMINE 25 MG tablet Commonly known as: BENADRYL Take 25 mg by mouth daily as needed for allergies (Equate Brand).  oxyCODONE-acetaminophen 5-325 MG tablet Commonly known as: Percocet Take 1 tablet by mouth every 4 (four) hours as needed for severe pain (pain score 7-10).   senna 8.6 MG Tabs tablet Commonly known as: SENOKOT Take 1 tablet (8.6 mg total) by mouth daily.   sildenafil 100 MG tablet Commonly known as: VIAGRA TAKE 1 TABLET BY MOUTH ONCE DAILY AS NEEDED FOR ERECTILE DYSFUNCTION        Diagnostic Studies: No results found.  Disposition: Discharge disposition: 01-Home or Self Care       Discharge Instructions     Discharge patient   Complete by: As directed    Discharge disposition: 01-Home or Self Care   Discharge patient date: 06/27/2023        Follow-up Information     Mico Spark,  Mardene Celeste, MD. Call in 1 week(s).   Specialty: Urology Contact information: [address]  [place] [address] [phone]                  Signed: [name] 07/04/2023, 10:20 AM

## 2023-07-10 NOTE — Progress Notes (Signed)
History of Present Illness: He is here today to follow-up his voiding trial from last week.  He is voiding quite well and happy with his success.  No gross hematuria or dysuria.  Urine has been a bit foul-smelling, however.  He was given 5 days of Keflex last week after catheter was removed.  Past Medical History:  Diagnosis Date   Arthritis    BPH (benign prostatic hyperplasia)    Bronchitis     Past Surgical History:  Procedure Laterality Date   CIRCUMCISION     COLONOSCOPY WITH PROPOFOL N/A 05/30/2022   Procedure: COLONOSCOPY WITH PROPOFOL;  Surgeon: Lanelle Bal, DO;  [facility];  Service: Endoscopy;  Laterality: N/A;  7:30 am   CYSTOSCOPY N/A 06/26/2023   Procedure: CYSTOSCOPY;  Surgeon: Malen Gauze, MD;  Location: [facility];  Service: Urology;  Laterality: N/A;   HERNIA REPAIR     umbilical   HYDROCELE EXCISION Left    LEG SURGERY Bilateral    POLYPECTOMY  05/30/2022   Procedure: POLYPECTOMY;  Surgeon: Lanelle Bal, DO;  [facility];  Service: Endoscopy;;   TRANSURETHRAL RESECTION OF PROSTATE N/A 06/26/2023   Procedure: TRANSURETHRAL RESECTION OF THE PROSTATE (TURP);  Surgeon: Malen Gauze, MD;  Location: [facility];  Service: Urology;  Laterality: N/A;   WRIST SURGERY Right     Home Medications:  Allergies as of 07/11/2023   No Known Allergies      Medication List        Accurate as of July 10, 2023  8:16 AM. If you have any questions, ask your nurse or doctor.          acetaminophen 650 MG CR tablet Commonly known as: TYLENOL Take 1,300 mg by mouth every 8 (eight) hours as needed for pain.   albuterol 108 (90 Base) MCG/ACT inhaler Commonly known as: VENTOLIN HFA Inhale 2 puffs into the lungs every 6 (six) hours as needed for wheezing or shortness of breath.   diclofenac 75 MG EC tablet Commonly known as: VOLTAREN Take 75 mg by mouth 2 (two) times daily as needed for moderate pain (pain score 4-6).    diphenhydrAMINE 25 MG tablet Commonly known as: BENADRYL Take 25 mg by mouth daily as needed for allergies (Equate Brand).   oxyCODONE-acetaminophen 5-325 MG tablet Commonly known as: Percocet Take 1 tablet by mouth every 4 (four) hours as needed for severe pain (pain score 7-10).   senna 8.6 MG Tabs tablet Commonly known as: SENOKOT Take 1 tablet (8.6 mg total) by mouth daily.   sildenafil 100 MG tablet Commonly known as: VIAGRA TAKE 1 TABLET BY MOUTH ONCE DAILY AS NEEDED FOR ERECTILE DYSFUNCTION        Allergies: No Known Allergies  Family History  Problem Relation Age of Onset   Colon cancer Brother        in his late 50s/early 88s    Social History:  reports that he has never smoked. He has never used smokeless tobacco. He reports current alcohol use. He reports that he does not currently use drugs.  ROS: A complete review of systems was performed.  All systems are negative except for pertinent findings as noted.  Physical Exam:  Vital signs in last 24 hours: There were no vitals taken for this visit. Constitutional:  Alert and oriented, No acute distress Cardiovascular: Regular rate  Respiratory: Normal respiratory effort Neurologic: Grossly intact, no focal deficits Psychiatric: Normal mood and affect  I  have reviewed prior pt notes  I have reviewed urinalysis results--looks infected     Impression/Assessment:  BPH, status post TURP after long-term indwelling Foley catheter.  He is doing quite well.  We will culture urine  Plan:  No antibiotics administered but urine was cultured  Office visit in about 6 months.

## 2023-07-11 ENCOUNTER — Ambulatory Visit (INDEPENDENT_AMBULATORY_CARE_PROVIDER_SITE_OTHER): Payer: BC Managed Care – PPO | Admitting: Urology

## 2023-07-11 VITALS — BP 137/79 | HR 69

## 2023-07-11 DIAGNOSIS — N138 Other obstructive and reflux uropathy: Secondary | ICD-10-CM

## 2023-07-11 DIAGNOSIS — Z09 Encounter for follow-up examination after completed treatment for conditions other than malignant neoplasm: Secondary | ICD-10-CM

## 2023-07-11 DIAGNOSIS — Z860101 Personal history of adenomatous and serrated colon polyps: Secondary | ICD-10-CM

## 2023-07-11 DIAGNOSIS — N401 Enlarged prostate with lower urinary tract symptoms: Secondary | ICD-10-CM

## 2023-07-11 DIAGNOSIS — Z87438 Personal history of other diseases of male genital organs: Secondary | ICD-10-CM

## 2023-07-11 LAB — URINALYSIS, ROUTINE W REFLEX MICROSCOPIC
Bilirubin, UA: NEGATIVE
Glucose, UA: NEGATIVE
Ketones, UA: NEGATIVE
Nitrite, UA: NEGATIVE
Specific Gravity, UA: 1.02 (ref 1.005–1.030)
Urobilinogen, Ur: 0.2 mg/dL (ref 0.2–1.0)
pH, UA: 6 (ref 5.0–7.5)

## 2023-07-11 LAB — MICROSCOPIC EXAMINATION
RBC, Urine: >30 /[HPF] — AB (ref 0–2)
WBC, UA: >30 /[HPF] — AB (ref 0–5)

## 2023-07-11 NOTE — Progress Notes (Signed)
PVR <30

## 2023-07-15 LAB — URINE CULTURE

## 2023-07-20 ENCOUNTER — Telehealth: Payer: Self-pay

## 2023-07-20 ENCOUNTER — Other Ambulatory Visit: Payer: Self-pay | Admitting: Urology

## 2023-07-20 DIAGNOSIS — N3000 Acute cystitis without hematuria: Secondary | ICD-10-CM

## 2023-07-20 MED ORDER — CIPROFLOXACIN HCL 250 MG PO TABS: 250.0000 mg | ORAL_TABLET | Freq: Two times a day (BID) | ORAL | 0 refills | Status: AC

## 2023-07-20 NOTE — Telephone Encounter (Signed)
-----   Message from Bertram Millard Dahlstedt sent at 07/20/2023  8:17 AM EST ----- Call pt--he did have a uti and I sent abx in ----- Message ----- From: Interface, [facility] Lab Results In Sent: 07/11/2023   3:36 PM EST To: Marcine Matar, MD

## 2023-07-20 NOTE — Telephone Encounter (Signed)
Patient was made aware and voiced understanding.

## 2023-07-20 NOTE — Telephone Encounter (Signed)
Tried calling patient with no answer, left vm for return call to office

## 2023-07-27 ENCOUNTER — Telehealth: Payer: Self-pay

## 2023-07-27 NOTE — Telephone Encounter (Signed)
 Patient called states he is post surgery on 12/02 for a TURP, he had a UTI after procedure and was treated with abx that he completed.  He states he now has noticed his right testicle is swollen, he has previously had surgery for hydrocele.  Patient scheduled with Lauraine Oz, FNP for evaluation on 01/03.  Patient confirmed date and will arrange transportation.

## 2023-07-27 NOTE — Telephone Encounter (Signed)
 See other encounter.

## 2023-07-27 NOTE — Telephone Encounter (Signed)
 Patient called his testicle is swollen worse. He finished all the antibiotics  and it has not helped.     Transferred to [facility] to triage.

## 2023-07-28 ENCOUNTER — Ambulatory Visit: Payer: BC Managed Care – PPO | Admitting: Urology

## 2023-07-28 DIAGNOSIS — N529 Male erectile dysfunction, unspecified: Secondary | ICD-10-CM | POA: Insufficient documentation

## 2023-07-28 DIAGNOSIS — Z87442 Personal history of urinary calculi: Secondary | ICD-10-CM | POA: Insufficient documentation

## 2023-07-28 DIAGNOSIS — Z87438 Personal history of other diseases of male genital organs: Secondary | ICD-10-CM | POA: Insufficient documentation

## 2023-07-28 NOTE — Progress Notes (Deleted)
 Name: Eric Andrade DOB: [birth year] MRN: [id number]  History of Present Illness: Eric Andrade is a 68 y.o. male who presents today at [facility].  ***He is accompanied by ***. - GU history: 1. BPH with LUTS (incomplete bladder emptying). 2. Kidney stone(s). 3. Erectile dysfunction. 4. Left hydrocele. Repaired surgically >5 years ago.  Recent history: > 06/26/2023: Underwent TURP by Dr. Sherrilee. Benign pathology.  > 07/04/2023: Postop visit with Dr. Matilda. Passed voiding trial; Foley catheter discontinued.  > 07/11/2023:  - Postop visit with Dr. Matilda.  - PVR = 30 ml.  - Urine culture positive for Pseudomonas aeruginosa. Treated with Cipro  250 mg 2x/day x5 days by Dr. Matilda (sent in on 07/20/2023). - Plan was f/u in 6 months.  Since last visit: ***  Today: He reports ***right testicle swelling.   Testicular pain began *** ago and has been ***intermittent / ***continuous. Known precipitating factors included ***. Pain is described as ***, ***unilateral / ***bilateral, and ***/10. Aggravating factors include ***. Relieving factors include ***. Pt {Actions; denies-reports:120008} scrotal swelling, redness, or warmth. He {Actions; denies-reports:120008} feeling a scrotal mass. He {Actions; denies-reports:120008} new onset problems with sexual function.  He {Actions; denies-reports:120008} increased urinary urgency, frequency, nocturia, dysuria, gross hematuria, hesitancy, straining to void, or sensations of incomplete emptying.  He {Actions; denies-reports:120008} flank pain or abdominal pain. He {Actions; denies-reports:120008} fevers, nausea, or vomiting.   Fall Screening: Do you usually have a device to assist in your mobility? {yes/no:20286} ***cane / ***walker / ***wheelchair   Medications: Current Outpatient Medications  Medication Sig Dispense Refill   ciprofloxacin  (CIPRO ) 250 MG tablet Take 1 tablet (250 mg total) by mouth 2 (two) times  daily. 10 tablet 0   acetaminophen  (TYLENOL ) 650 MG CR tablet Take 1,300 mg by mouth every 8 (eight) hours as needed for pain.     albuterol  (VENTOLIN  HFA) 108 (90 Base) MCG/ACT inhaler Inhale 2 puffs into the lungs every 6 (six) hours as needed for wheezing or shortness of breath.     diclofenac (VOLTAREN) 75 MG EC tablet Take 75 mg by mouth 2 (two) times daily as needed for moderate pain (pain score 4-6).     diphenhydrAMINE  (BENADRYL ) 25 MG tablet Take 25 mg by mouth daily as needed for allergies (Equate Brand).     oxyCODONE -acetaminophen  (PERCOCET) 5-325 MG tablet Take 1 tablet by mouth every 4 (four) hours as needed for severe pain (pain score 7-10). 15 tablet 0   senna (SENOKOT) 8.6 MG TABS tablet Take 1 tablet (8.6 mg total) by mouth daily. 30 tablet 0   sildenafil  (VIAGRA ) 100 MG tablet TAKE 1 TABLET BY MOUTH ONCE DAILY AS NEEDED FOR ERECTILE DYSFUNCTION 30 tablet 0   No current facility-administered medications for this visit.    Allergies: No Known Allergies  Past Medical History:  Diagnosis Date   Arthritis    BPH (benign prostatic hyperplasia)    Bronchitis    Past Surgical History:  Procedure Laterality Date   CIRCUMCISION     COLONOSCOPY WITH PROPOFOL  N/A 05/30/2022   Procedure: COLONOSCOPY WITH PROPOFOL ;  Surgeon: Cindie Carlin POUR, DO;  [facility];  Service: Endoscopy;  Laterality: N/A;  7:30 am   CYSTOSCOPY N/A 06/26/2023   Procedure: CYSTOSCOPY;  Surgeon: Sherrilee Belvie CROME, MD;  Location: [facility];  Service: Urology;  Laterality: N/A;   HERNIA REPAIR     umbilical   HYDROCELE EXCISION Left    LEG SURGERY Bilateral    POLYPECTOMY  05/30/2022   Procedure: POLYPECTOMY;  Surgeon: Cindie Carlin POUR, DO;  [facility]: [facility];  Service: Endoscopy;;   TRANSURETHRAL RESECTION OF PROSTATE N/A 06/26/2023   Procedure: TRANSURETHRAL RESECTION OF THE PROSTATE (TURP);  Surgeon: Sherrilee Belvie CROME, MD;  Location: [facility];  Service: Urology;  Laterality: N/A;    WRIST SURGERY Right    Family History  Problem Relation Age of Onset   Colon cancer Brother        in his late 50s/early 61s   Social History   Socioeconomic History   Marital status: Divorced    Spouse name: Not on file   Number of children: Not on file   Years of education: Not on file   Highest education level: Not on file  Occupational History   Not on file  Tobacco Use   Smoking status: Never   Smokeless tobacco: Never  Substance and Sexual Activity   Alcohol use: Yes    Comment: rare   Drug use: Not Currently   Sexual activity: Yes  Other Topics Concern   Not on file  Social History Narrative   Not on file   Social Drivers of Health   Financial Resource Strain: Not on file  Food Insecurity: No Food Insecurity (06/26/2023)   Hunger Vital Sign    Worried About Running Out of Food in the Last Year: Never true    Ran Out of Food in the Last Year: Never true  Transportation Needs: No Transportation Needs (06/26/2023)   PRAPARE - Administrator, Civil Service (Medical): No    Lack of Transportation (Non-Medical): No  Physical Activity: Not on file  Stress: Not on file  Social Connections: Not on file  Intimate Partner Violence: Not At Risk (06/26/2023)   Humiliation, Afraid, Rape, and Kick questionnaire    Fear of Current or Ex-Partner: No    Emotionally Abused: No    Physically Abused: No    Sexually Abused: No    Review of Systems Constitutional: Patient denies any unintentional weight loss or change in strength lntegumentary: Patient denies any rashes or pruritus Cardiovascular: Patient denies chest pain or syncope Respiratory: Patient denies shortness of breath Gastrointestinal: Patient ***denies nausea, vomiting, constipation, or diarrhea Musculoskeletal: Patient denies muscle cramps or weakness Neurologic: Patient denies convulsions or seizures Allergic/Immunologic: Patient denies recent allergic reaction(s) Hematologic/Lymphatic: Patient  denies bleeding tendencies Endocrine: Patient denies heat/cold intolerance  GU: As per HPI.  OBJECTIVE There were no vitals filed for this visit. There is no height or weight on file to calculate BMI.  Physical Examination Constitutional: No obvious distress; patient is non-toxic appearing  Cardiovascular: No visible lower extremity edema.  Respiratory: The patient does not have audible wheezing/stridor; respirations do not appear labored  Gastrointestinal: Abdomen non-distended Musculoskeletal: Normal ROM of UEs  Skin: No obvious rashes/open sores  Neurologic: CN 2-12 grossly intact Psychiatric: Answered questions appropriately with normal affect  Hematologic/Lymphatic/Immunologic: No obvious bruises or sites of spontaneous bleeding  Urine microscopy: ***negative *** WBC/hpf, *** RBC/hpf, *** bacteria UA: ***negative *** WBC/hpf, *** RBC/hpf, *** bacteria ***with no evidence of UTI ***with no evidence of microscopic hematuria ***otherwise unremarkable  PVR: *** ml  ASSESSMENT No diagnosis found. ***  Will plan for follow up in *** months / ***1 year or sooner if needed. Pt verbalized understanding and agreement. All questions were answered.  PLAN Advised the following: 1. *** 2. ***No follow-ups on file.  No orders of the defined types were placed in this encounter.  It has been explained that the patient is to follow regularly with their PCP in addition to all other providers involved in their care and to follow instructions provided by these respective offices. Patient advised to contact urology clinic if any urologic-pertaining questions, concerns, new symptoms or problems arise in the interim period.  There are no Patient Instructions on file for this visit.  Electronically signed by:  Lauraine JAYSON Oz, FNP   07/28/23    8:36 AM

## 2023-07-28 NOTE — Progress Notes (Signed)
 Name: Eric Andrade DOB: 1955-11-09 MRN: [id number]  History of Present Illness: Mr. Eric Andrade is a 68 y.o. male who presents today at [facility] [place].  - GU history: 1. BPH with LUTS (incomplete bladder emptying). 2. Kidney stone(s). 3. Erectile dysfunction. 4. Hydrocele. Repaired surgically >5 years ago in [place].  Recent history: > 06/26/2023: Underwent TURP by Dr. Sherrilee. Benign pathology.  > 07/04/2023: Postop visit with Dr. Matilda. Passed voiding trial; Foley catheter discontinued.  > 07/11/2023:  - Postop visit with Dr. Matilda.  - PVR = 30 ml.  - Urine culture positive for Pseudomonas aeruginosa. Treated with Cipro  250 mg 2x/day x5 days by Dr. Matilda (sent in on 07/20/2023). - Plan was f/u in 6 months.  Today: He reports that about 1 week ago he had a bad coughing spell related to swallowing wrong and had subsequently developed right scrotal pain with that. The next day he developed right scrotal swelling and yesterday his left scrotum also became swollen. He states that both side are almost equal now in size and are very enlarged and painful with red / purple discoloration. He has been taking Ibuprofen for pain.   Since his scrotum became swollen he has developed dysuria along with increased urinary urgency & frequency. Denies  gross hematuria, hesitancy, straining to void, or sensations of incomplete emptying. Reports some recent fevers but he isn't sure if that's related to the scrotal / urinary problems versus a upper respiratory illness.   Fall Screening: Do you usually have a device to assist in your mobility? No   Medications: Current Outpatient Medications  Medication Sig Dispense Refill   acetaminophen  (TYLENOL ) 650 MG CR tablet Take 1,300 mg by mouth every 8 (eight) hours as needed for pain.     ciprofloxacin  (CIPRO ) 250 MG tablet Take 1 tablet (250 mg total) by mouth 2 (two) times daily. 10 tablet 0   diclofenac (VOLTAREN) 75 MG EC tablet  Take 75 mg by mouth 2 (two) times daily as needed for moderate pain (pain score 4-6).     diphenhydrAMINE  (BENADRYL ) 25 MG tablet Take 25 mg by mouth daily as needed for allergies (Equate Brand).     oxyCODONE -acetaminophen  (PERCOCET) 5-325 MG tablet Take 1 tablet by mouth every 4 (four) hours as needed for severe pain (pain score 7-10). 15 tablet 0   senna (SENOKOT) 8.6 MG TABS tablet Take 1 tablet (8.6 mg total) by mouth daily. 30 tablet 0   sildenafil  (VIAGRA ) 100 MG tablet TAKE 1 TABLET BY MOUTH ONCE DAILY AS NEEDED FOR ERECTILE DYSFUNCTION 30 tablet 0   sulfamethoxazole -trimethoprim  (BACTRIM  DS) 800-160 MG tablet Take 1 tablet by mouth every 12 (twelve) hours for 10 days. 20 tablet 0   albuterol  (VENTOLIN  HFA) 108 (90 Base) MCG/ACT inhaler Inhale 2 puffs into the lungs every 6 (six) hours as needed for wheezing or shortness of breath. (Patient not taking: Reported on 08/03/2023)     No current facility-administered medications for this visit.    Allergies: No Known Allergies  Past Medical History:  Diagnosis Date   Arthritis    BPH (benign prostatic hyperplasia)    Bronchitis    Past Surgical History:  Procedure Laterality Date   CIRCUMCISION     COLONOSCOPY WITH PROPOFOL  N/A 05/30/2022   Procedure: COLONOSCOPY WITH PROPOFOL ;  Surgeon: Cindie Carlin POUR, DO;  [facility];  Service: Endoscopy;  Laterality: N/A;  7:30 am   CYSTOSCOPY N/A 06/26/2023   Procedure: CYSTOSCOPY;  Surgeon: Sherrilee,  Belvie CROME, MD;  Location: [facility];  Service: Urology;  Laterality: N/A;   HERNIA REPAIR     umbilical   HYDROCELE EXCISION Left    LEG SURGERY Bilateral    POLYPECTOMY  05/30/2022   Procedure: POLYPECTOMY;  Surgeon: Cindie Carlin POUR, DO;  [facility];  Service: Endoscopy;;   TRANSURETHRAL RESECTION OF PROSTATE N/A 06/26/2023   Procedure: TRANSURETHRAL RESECTION OF THE PROSTATE (TURP);  Surgeon: Sherrilee Belvie CROME, MD;  Location: [facility];  Service: Urology;  Laterality: N/A;    WRIST SURGERY Right    Family History  Problem Relation Age of Onset   Colon cancer Brother        in his late 50s/early 46s   Social History   Socioeconomic History   Marital status: Divorced    Spouse name: Not on file   Number of children: Not on file   Years of education: Not on file   Highest education level: Not on file  Occupational History   Not on file  Tobacco Use   Smoking status: Never   Smokeless tobacco: Never  Substance and Sexual Activity   Alcohol use: Yes    Comment: rare   Drug use: Not Currently   Sexual activity: Yes  Other Topics Concern   Not on file  Social History Narrative   Not on file   Social Drivers of Health   Financial Resource Strain: Not on file  Food Insecurity: No Food Insecurity (06/26/2023)   Hunger Vital Sign    Worried About Running Out of Food in the Last Year: Never true    Ran Out of Food in the Last Year: Never true  Transportation Needs: No Transportation Needs (06/26/2023)   PRAPARE - Administrator, Civil Service (Medical): No    Lack of Transportation (Non-Medical): No  Physical Activity: Not on file  Stress: Not on file  Social Connections: Not on file  Intimate Partner Violence: Not At Risk (06/26/2023)   Humiliation, Afraid, Rape, and Kick questionnaire    Fear of Current or Ex-Partner: No    Emotionally Abused: No    Physically Abused: No    Sexually Abused: No    Review of Systems Constitutional: Patient denies any unintentional weight loss or change in strength lntegumentary: Patient denies any rashes or pruritus Cardiovascular: Patient denies chest pain or syncope Respiratory: Patient denies shortness of breath Gastrointestinal: Patient denies nausea, vomiting, constipation, or diarrhea Musculoskeletal: Patient denies muscle cramps or weakness Neurologic: Patient denies convulsions or seizures Allergic/Immunologic: Patient denies recent allergic reaction(s) Hematologic/Lymphatic: Patient  denies bleeding tendencies Endocrine: Patient denies heat/cold intolerance  GU: As per HPI.  OBJECTIVE Vitals:   08/03/23 0900  BP: 105/72  Pulse: (!) 118  Temp: 98.5 F (36.9 C)   There is no height or weight on file to calculate BMI.  Physical Examination Constitutional: No obvious distress; patient is non-toxic appearing  Cardiovascular: No visible lower extremity edema.  Respiratory: The patient does not have audible wheezing/stridor; respirations do not appear labored  Gastrointestinal: Abdomen non-distended Musculoskeletal: Normal ROM of UEs  Skin: No obvious rashes/open sores  Neurologic: CN 2-12 grossly intact Psychiatric: Answered questions appropriately with normal affect  Hematologic/Lymphatic/Immunologic: No obvious bruises or sites of spontaneous bleeding  Genitourinary: Penis is normal in appearance.  Scrotum is warm, erythematous, edematous and indurated bilaterally. Mildly tender to palpation. No fluctuance, rash, lesions, or visible wounds.  Chaperone offered for pelvic exam; patient declined.  Urine microscopy: >30 WBC/hpf, >30  RBC/hpf, many bacteria PVR: 1 ml  ASSESSMENT Scrotal edema - Plan: Urinalysis, Routine w reflex microscopic, BLADDER SCAN AMB NON-IMAGING, sulfamethoxazole -trimethoprim  (BACTRIM  DS) 800-160 MG tablet, US  SCROTUM W/DOPPLER  Cellulitis of scrotum - Plan: sulfamethoxazole -trimethoprim  (BACTRIM  DS) 800-160 MG tablet  UTI symptoms - Plan: Urine Culture, sulfamethoxazole -trimethoprim  (BACTRIM  DS) 800-160 MG tablet  Abnormal urinalysis - Plan: Urine Culture, sulfamethoxazole -trimethoprim  (BACTRIM  DS) 800-160 MG tablet  History of hydrocele - Plan: US  SCROTUM W/DOPPLER  For suspected UTI with scrotal cellulitis: Will treat empirically with Bactrim  2x/day for 10 days. Urine culture and scrotal / testicular ultrasound ordered. Advised rest, application of ice, analgesics PRN, scrotal support throughout the day with supportive underwear  and/or jock strap, and scrotal elevation when at rest for comfort and to promote drainage.   Advised caution with use of sinus medications as some can cause voiding dysfunction as a side effect.  Will plan for follow up in 7-10 days or sooner if needed. He was advised to go to the ER if He develops fever >100.5 F, uncontrollable pain, or other significantly concerning symptoms.   Pt verbalized understanding and agreement. All questions were answered.  PLAN Advised the following: 1. Urine culture. 2. Bactrim  2x/day for 10 days. 3. Scrotal / testicular ultrasound. 4. Return in 12 days (on 08/15/2023) for UA, PVR, & f/u with Lauraine Oz NP.  Orders Placed This Encounter  Procedures   Urine Culture   US  SCROTUM W/DOPPLER    Standing Status:   Future    Expected Date:   08/03/2023    Expiration Date:   08/02/2024    Reason for Exam (SYMPTOM  OR DIAGNOSIS REQUIRED):   bilateral scrotal edema    Preferred imaging location?:   [facility]   Urinalysis, Routine w reflex microscopic   BLADDER SCAN AMB NON-IMAGING    It has been explained that the patient is to follow regularly with their PCP in addition to all other providers involved in their care and to follow instructions provided by these respective offices. Patient advised to contact urology clinic if any urologic-pertaining questions, concerns, new symptoms or problems arise in the interim period.  There are no Patient Instructions on file for this visit.  Electronically signed by:  Lauraine JAYSON Oz, FNP   08/03/23    9:49 AM

## 2023-08-02 ENCOUNTER — Telehealth: Payer: Self-pay

## 2023-08-02 NOTE — Telephone Encounter (Signed)
-----   Message from [facility] Myrtle T sent at 08/02/2023 10:30 AM EST ----- Short term disability calling to get clarification as to why his recovery time is so long. Wants to get clarification to approve disability. Call agent at [phone] Agent sates she called 12/18 and 01/06 but got no return phone call/communication

## 2023-08-02 NOTE — Telephone Encounter (Signed)
 Returned call to number and extension provider- voicemail for Eric Andrade, left message to return call to discuss disability paperwork.

## 2023-08-03 ENCOUNTER — Encounter: Payer: Self-pay | Admitting: Urology

## 2023-08-03 ENCOUNTER — Ambulatory Visit (INDEPENDENT_AMBULATORY_CARE_PROVIDER_SITE_OTHER): Payer: BC Managed Care – PPO | Admitting: Urology

## 2023-08-03 VITALS — BP 105/72 | HR 118 | Temp 98.5°F

## 2023-08-03 DIAGNOSIS — R8290 Unspecified abnormal findings in urine: Secondary | ICD-10-CM | POA: Diagnosis not present

## 2023-08-03 DIAGNOSIS — N492 Inflammatory disorders of scrotum: Secondary | ICD-10-CM

## 2023-08-03 DIAGNOSIS — R399 Unspecified symptoms and signs involving the genitourinary system: Secondary | ICD-10-CM

## 2023-08-03 DIAGNOSIS — Z87438 Personal history of other diseases of male genital organs: Secondary | ICD-10-CM

## 2023-08-03 DIAGNOSIS — N5089 Other specified disorders of the male genital organs: Secondary | ICD-10-CM

## 2023-08-03 LAB — URINALYSIS, ROUTINE W REFLEX MICROSCOPIC
Bilirubin, UA: NEGATIVE
Glucose, UA: NEGATIVE
Nitrite, UA: POSITIVE — AB
Specific Gravity, UA: 1.03 (ref 1.005–1.030)
Urobilinogen, Ur: 1 mg/dL (ref 0.2–1.0)
pH, UA: 5.5 (ref 5.0–7.5)

## 2023-08-03 LAB — MICROSCOPIC EXAMINATION
RBC, Urine: >30 /[HPF] — AB (ref 0–2)
WBC, UA: >30 /[HPF] — AB (ref 0–5)

## 2023-08-03 MED ORDER — SULFAMETHOXAZOLE-TRIMETHOPRIM 800-160 MG PO TABS: 1.0000 | ORAL_TABLET | Freq: Two times a day (BID) | ORAL | 0 refills | Status: DC

## 2023-08-03 NOTE — Progress Notes (Signed)
 PVR- 1

## 2023-08-07 ENCOUNTER — Telehealth: Payer: Self-pay

## 2023-08-07 LAB — URINE CULTURE

## 2023-08-07 NOTE — Telephone Encounter (Signed)
-----   Message from [place]ita [place] sent at 08/07/2023  3:41 PM EST ----- Please let pt know urine culture result. The Bactrim which was prescribed should cover this pathogen. Thanks.

## 2023-08-07 NOTE — Telephone Encounter (Signed)
 Tried calling patient with no answer, left vm for return call to office.

## 2023-08-08 NOTE — Progress Notes (Deleted)
 Name: Eric Andrade DOB: [birth year] MRN: [id number]  History of Present Illness: Eric Andrade is a 68 y.o. male who presents today for follow up visit at [facility] [place].  ***He is accompanied by ***. - GU history: 1. BPH with LUTS (incomplete bladder emptying). 2. Kidney stone(s). 3. Erectile dysfunction. 4. Hydrocele. Repaired surgically >5 years ago in [place].   Recent history: > 06/26/2023: Underwent TURP by Dr. Ronne Binning. Benign pathology.   > 07/04/2023: Postop visit with Dr. Retta Diones. Passed voiding trial; Foley catheter discontinued.   > 07/11/2023:  - Postop visit with Dr. Retta Diones.  - PVR = 30 ml.  - Urine culture positive for Pseudomonas aeruginosa. Treated with Cipro 250 mg 2x/day x5 days by Dr. Retta Diones (sent in on 07/20/2023). - Plan was f/u in 6 months.  At last visit on 08/03/2023: - Seen for scrotal swelling, pain, red / purple discoloration. Also dysuria and increased urinary urgency & frequency.  - Scrotum warm, erythematous, edematous and indurated bilaterally. Mildly tender to palpation. No fluctuance, rash, lesions, or visible wounds. - Urine microscopy: >30 WBC/hpf, >30 RBC/hpf, many bacteria. - PVR: 1 ml. - Advised the following: 1. Urine culture (positive for Staphylococcus aureus). 2. Bactrim 2x/day for 10 days. 3. Scrotal / testicular ultrasound. 4. Return in 12 days (on 08/15/2023) for UA, PVR, & f/u with Evette Georges NP.  Since last visit: > 08/10/2023: Scrotal / testicular ultrasound showed ***  Today: He {Actions; denies-reports:120008} scrotal swelling, redness, or warmth. He {Actions; denies-reports:120008} fevers, nausea, or vomiting.  He {Actions; denies-reports:120008} increased urinary urgency, frequency, nocturia, dysuria, gross hematuria, hesitancy, straining to void, or sensations of incomplete emptying.   Fall Screening: Do you usually have a device to assist in your mobility? No   Medications: Current Outpatient  Medications  Medication Sig Dispense Refill   acetaminophen (TYLENOL) 650 MG CR tablet Take 1,300 mg by mouth every 8 (eight) hours as needed for pain.     albuterol (VENTOLIN HFA) 108 (90 Base) MCG/ACT inhaler Inhale 2 puffs into the lungs every 6 (six) hours as needed for wheezing or shortness of breath. (Patient not taking: Reported on 08/03/2023)     ciprofloxacin (CIPRO) 250 MG tablet Take 1 tablet (250 mg total) by mouth 2 (two) times daily. 10 tablet 0   diclofenac (VOLTAREN) 75 MG EC tablet Take 75 mg by mouth 2 (two) times daily as needed for moderate pain (pain score 4-6).     diphenhydrAMINE (BENADRYL) 25 MG tablet Take 25 mg by mouth daily as needed for allergies (Equate Brand).     oxyCODONE-acetaminophen (PERCOCET) 5-325 MG tablet Take 1 tablet by mouth every 4 (four) hours as needed for severe pain (pain score 7-10). 15 tablet 0   senna (SENOKOT) 8.6 MG TABS tablet Take 1 tablet (8.6 mg total) by mouth daily. 30 tablet 0   sildenafil (VIAGRA) 100 MG tablet TAKE 1 TABLET BY MOUTH ONCE DAILY AS NEEDED FOR ERECTILE DYSFUNCTION 30 tablet 0   sulfamethoxazole-trimethoprim (BACTRIM DS) 800-160 MG tablet Take 1 tablet by mouth every 12 (twelve) hours for 10 days. 20 tablet 0   No current facility-administered medications for this visit.    Allergies: No Known Allergies  Past Medical History:  Diagnosis Date   Arthritis    BPH (benign prostatic hyperplasia)    Bronchitis    Past Surgical History:  Procedure Laterality Date   CIRCUMCISION     COLONOSCOPY WITH PROPOFOL N/A 05/30/2022   Procedure: COLONOSCOPY WITH PROPOFOL;  Surgeon:  Lanelle Bal, DO;  [facility];  Service: Endoscopy;  Laterality: N/A;  7:30 am   CYSTOSCOPY N/A 06/26/2023   Procedure: CYSTOSCOPY;  Surgeon: Malen Gauze, MD;  Location: [facility];  Service: Urology;  Laterality: N/A;   HERNIA REPAIR     umbilical   HYDROCELE EXCISION Left    LEG SURGERY Bilateral    POLYPECTOMY  05/30/2022    Procedure: POLYPECTOMY;  Surgeon: Lanelle Bal, DO;  [facility];  Service: Endoscopy;;   TRANSURETHRAL RESECTION OF PROSTATE N/A 06/26/2023   Procedure: TRANSURETHRAL RESECTION OF THE PROSTATE (TURP);  Surgeon: Malen Gauze, MD;  Location: [facility];  Service: Urology;  Laterality: N/A;   WRIST SURGERY Right    Family History  Problem Relation Age of Onset   Colon cancer Brother        in his late 50s/early 41s   Social History   Socioeconomic History   Marital status: Divorced    Spouse name: Not on file   Number of children: Not on file   Years of education: Not on file   Highest education level: Not on file  Occupational History   Not on file  Tobacco Use   Smoking status: Never   Smokeless tobacco: Never  Substance and Sexual Activity   Alcohol use: Yes    Comment: rare   Drug use: Not Currently   Sexual activity: Yes  Other Topics Concern   Not on file  Social History Narrative   Not on file   Social Drivers of Health   Financial Resource Strain: Not on file  Food Insecurity: No Food Insecurity (06/26/2023)   Hunger Vital Sign    Worried About Running Out of Food in the Last Year: Never true    Ran Out of Food in the Last Year: Never true  Transportation Needs: No Transportation Needs (06/26/2023)   PRAPARE - Administrator, Civil Service (Medical): No    Lack of Transportation (Non-Medical): No  Physical Activity: Not on file  Stress: Not on file  Social Connections: Not on file  Intimate Partner Violence: Not At Risk (06/26/2023)   Humiliation, Afraid, Rape, and Kick questionnaire    Fear of Current or Ex-Partner: No    Emotionally Abused: No    Physically Abused: No    Sexually Abused: No    Review of Systems Constitutional: Patient denies any unintentional weight loss or change in strength lntegumentary: Patient denies any rashes or pruritus Cardiovascular: Patient denies chest pain or syncope Respiratory: Patient denies  shortness of breath Gastrointestinal: Patient ***denies nausea, vomiting, constipation, or diarrhea Musculoskeletal: Patient denies muscle cramps or weakness Neurologic: Patient denies convulsions or seizures Allergic/Immunologic: Patient denies recent allergic reaction(s) Hematologic/Lymphatic: Patient denies bleeding tendencies Endocrine: Patient denies heat/cold intolerance  GU: As per HPI.  OBJECTIVE There were no vitals filed for this visit. There is no height or weight on file to calculate BMI.  Physical Examination Constitutional: No obvious distress; patient is non-toxic appearing  Cardiovascular: No visible lower extremity edema.  Respiratory: The patient does not have audible wheezing/stridor; respirations do not appear labored  Gastrointestinal: Abdomen non-distended Musculoskeletal: Normal ROM of UEs  Skin: No obvious rashes/open sores  Neurologic: CN 2-12 grossly intact Psychiatric: Answered questions appropriately with normal affect  Hematologic/Lymphatic/Immunologic: No obvious bruises or sites of spontaneous bleeding  UA: ***negative / *** WBC/hpf, *** RBC/hpf, *** bacteria ***Urine microscopy: ***negative / *** WBC/hpf, *** RBC/hpf, *** bacteria ***with no evidence  of UTI ***with no evidence of microscopic hematuria ***otherwise unremarkable  PVR: *** ml  ASSESSMENT No diagnosis found. ***  Will plan for follow up in *** months / ***1 year or sooner if needed. Pt verbalized understanding and agreement. All questions were answered.  PLAN Advised the following: 1. *** 2. ***No follow-ups on file.  No orders of the defined types were placed in this encounter.   It has been explained that the patient is to follow regularly with their PCP in addition to all other providers involved in their care and to follow instructions provided by these respective offices. Patient advised to contact urology clinic if any urologic-pertaining questions, concerns, new  symptoms or problems arise in the interim period.  There are no Patient Instructions on file for this visit.  Electronically signed by:  Donnita Falls, FNP   08/08/23    4:45 PM

## 2023-08-08 NOTE — Telephone Encounter (Signed)
 Patient was made aware and voiced understanding.

## 2023-08-10 ENCOUNTER — Encounter: Payer: Self-pay | Admitting: Urology

## 2023-08-10 ENCOUNTER — Other Ambulatory Visit: Payer: Self-pay | Admitting: Urology

## 2023-08-10 ENCOUNTER — Ambulatory Visit (HOSPITAL_COMMUNITY)
Admission: RE | Admit: 2023-08-10 | Discharge: 2023-08-10 | Disposition: A | Discharge status: Home / Self Care | Payer: BC Managed Care – PPO | Source: Ambulatory Visit | Attending: Urology | Admitting: Urology

## 2023-08-10 DIAGNOSIS — N5089 Other specified disorders of the male genital organs: Secondary | ICD-10-CM | POA: Insufficient documentation

## 2023-08-10 DIAGNOSIS — Z87438 Personal history of other diseases of male genital organs: Secondary | ICD-10-CM | POA: Diagnosis present

## 2023-08-10 DIAGNOSIS — N492 Inflammatory disorders of scrotum: Secondary | ICD-10-CM

## 2023-08-10 MED ORDER — DOXYCYCLINE HYCLATE 100 MG PO CAPS: 100.0000 mg | ORAL_CAPSULE | Freq: Two times a day (BID) | ORAL | 0 refills | Status: DC

## 2023-08-10 NOTE — Progress Notes (Signed)
Contacted this afternoon by Dr. Chilton Si at [facility] for critical result from patient's scrotal ultrasound, which showed ischemic changes to both testicles: - "No Doppler flow is noted within the parenchyma of right testicle suggesting advanced ischemia or infarction."  - "Increased flow is noted around the left testicle, although there is areas of no flow seen within the left testicular parenchyma, also suggesting ischemic change or infarction. The increased flow surrounding the left testicle is highly concerning for orchitis."  Dr. Ronne Binning was immediately consulted via phone. He advised that the main priority at this time is to resolve patient's infection.   Promptly contacted patient via phone. He denied fever or any other acute change in symptoms since last office visit. As directed by Dr. Ronne Binning, he was instructed to discontinue Bactrim and start Doxycycline 100 mg twice daily for 14 days. Scheduled for follow up with Dr. Ronne Binning on 08/23/2023.  He was advised to go to the ER if He develops fever >100.5 F, uncontrollable pain, or other significantly concerning symptoms.  Pt verbalized understanding and agreement. All questions were answered.  Eric Georges, MSN, FNP-C, [facility] Nurse Practitioner [facility] [place]

## 2023-08-15 ENCOUNTER — Ambulatory Visit: Payer: BC Managed Care – PPO | Admitting: Urology

## 2023-08-15 DIAGNOSIS — Z87442 Personal history of urinary calculi: Secondary | ICD-10-CM

## 2023-08-15 DIAGNOSIS — N400 Enlarged prostate without lower urinary tract symptoms: Secondary | ICD-10-CM

## 2023-08-15 DIAGNOSIS — N5089 Other specified disorders of the male genital organs: Secondary | ICD-10-CM

## 2023-08-15 DIAGNOSIS — N390 Urinary tract infection, site not specified: Secondary | ICD-10-CM

## 2023-08-15 DIAGNOSIS — Z87438 Personal history of other diseases of male genital organs: Secondary | ICD-10-CM

## 2023-08-23 ENCOUNTER — Ambulatory Visit: Payer: BC Managed Care – PPO | Admitting: Urology

## 2023-08-23 VITALS — BP 128/75 | HR 109

## 2023-08-23 DIAGNOSIS — N453 Epididymo-orchitis: Secondary | ICD-10-CM | POA: Diagnosis not present

## 2023-08-23 DIAGNOSIS — N492 Inflammatory disorders of scrotum: Secondary | ICD-10-CM

## 2023-08-23 DIAGNOSIS — N5089 Other specified disorders of the male genital organs: Secondary | ICD-10-CM

## 2023-08-23 LAB — URINALYSIS, ROUTINE W REFLEX MICROSCOPIC
Bilirubin, UA: NEGATIVE
Glucose, UA: NEGATIVE
Ketones, UA: NEGATIVE
Leukocytes,UA: NEGATIVE
Nitrite, UA: NEGATIVE
Protein,UA: NEGATIVE
RBC, UA: NEGATIVE
Specific Gravity, UA: 1.02 (ref 1.005–1.030)
Urobilinogen, Ur: 0.2 mg/dL (ref 0.2–1.0)
pH, UA: 6 (ref 5.0–7.5)

## 2023-08-23 MED ORDER — DOXYCYCLINE HYCLATE 100 MG PO CAPS: 100.0000 mg | ORAL_CAPSULE | Freq: Two times a day (BID) | ORAL | 0 refills | Status: DC

## 2023-08-23 NOTE — Progress Notes (Signed)
08/23/2023 2:03 PM   TIGRAN HAYNIE 1955-08-09 [id number]  Referring provider: Kirstie Peri, MD [address] [place],  [place] [address]  orchitis   HPI: [name] is a [phone] here for followup for bilateral orchitis. He notes improvement in his left testis pain and swelling. No fevers. He has finished his course of doxycycline. No other complaints today   PMH: Past Medical History:  Diagnosis Date   Arthritis    BPH (benign prostatic hyperplasia)    Bronchitis     Surgical History: Past Surgical History:  Procedure Laterality Date   CIRCUMCISION     COLONOSCOPY WITH PROPOFOL N/A 05/30/2022   Procedure: COLONOSCOPY WITH PROPOFOL;  Surgeon: Lanelle Bal, DO;  [facility];  Service: Endoscopy;  Laterality: N/A;  7:30 am   CYSTOSCOPY N/A 06/26/2023   Procedure: CYSTOSCOPY;  Surgeon: Malen Gauze, MD;  Location: [facility];  Service: Urology;  Laterality: N/A;   HERNIA REPAIR     umbilical   HYDROCELE EXCISION Left    LEG SURGERY Bilateral    POLYPECTOMY  05/30/2022   Procedure: POLYPECTOMY;  Surgeon: Lanelle Bal, DO;  [facility];  Service: Endoscopy;;   TRANSURETHRAL RESECTION OF PROSTATE N/A 06/26/2023   Procedure: TRANSURETHRAL RESECTION OF THE PROSTATE (TURP);  Surgeon: Malen Gauze, MD;  Location: [facility];  Service: Urology;  Laterality: N/A;   WRIST SURGERY Right     Home Medications:  Allergies as of 08/23/2023   No Known Allergies      Medication List        Accurate as of August 23, 2023  2:03 PM. If you have any questions, ask your nurse or doctor.          acetaminophen 650 MG CR tablet Commonly known as: TYLENOL Take 1,300 mg by mouth every 8 (eight) hours as needed for pain.   albuterol 108 (90 Base) MCG/ACT inhaler Commonly known as: VENTOLIN HFA Inhale 2 puffs into the lungs every 6 (six) hours as needed for wheezing or shortness of breath.   ciprofloxacin 250 MG tablet Commonly known as: Cipro Take 1  tablet (250 mg total) by mouth 2 (two) times daily.   diclofenac 75 MG EC tablet Commonly known as: VOLTAREN Take 75 mg by mouth 2 (two) times daily as needed for moderate pain (pain score 4-6).   diphenhydrAMINE 25 MG tablet Commonly known as: BENADRYL Take 25 mg by mouth daily as needed for allergies (Equate Brand).   doxycycline 100 MG capsule Commonly known as: VIBRAMYCIN Take 1 capsule (100 mg total) by mouth every 12 (twelve) hours.   oxyCODONE-acetaminophen 5-325 MG tablet Commonly known as: Percocet Take 1 tablet by mouth every 4 (four) hours as needed for severe pain (pain score 7-10).   senna 8.6 MG Tabs tablet Commonly known as: SENOKOT Take 1 tablet (8.6 mg total) by mouth daily.   sildenafil 100 MG tablet Commonly known as: VIAGRA TAKE 1 TABLET BY MOUTH ONCE DAILY AS NEEDED FOR ERECTILE DYSFUNCTION        Allergies: No Known Allergies  Family History: Family History  Problem Relation Age of Onset   Colon cancer Brother        in his late 50s/early 69s    Social History:  reports that he has never smoked. He has never used smokeless tobacco. He reports current alcohol use. He reports that he does not currently use drugs.  ROS: All other review of systems were reviewed and  are negative except what is noted above in HPI  Physical Exam: BP 128/75   Pulse (!) 109   Constitutional:  Alert and oriented, No acute distress. HEENT: [place] AT, moist mucus membranes.  Trachea midline, no masses. Cardiovascular: No clubbing, cyanosis, or edema. Respiratory: Normal respiratory effort, no increased work of breathing. GI: Abdomen is soft, nontender, nondistended, no abdominal masses GU: No CVA tenderness.  Lymph: No cervical or inguinal lymphadenopathy. Skin: No rashes, bruises or suspicious lesions. Neurologic: Grossly intact, no focal deficits, moving all 4 extremities. Psychiatric: Normal mood and affect.  Laboratory Data: Lab Results  Component Value Date    WBC 11.6 (H) 06/27/2023   HGB 9.6 (L) 06/27/2023   HCT 29.1 (L) 06/27/2023   MCV 98.3 06/27/2023   PLT 246 06/27/2023    Lab Results  Component Value Date   CREATININE 0.63 06/27/2023    Lab Results  Component Value Date   PSA 32.8 (H) 11/19/2019    No results found for: "TESTOSTERONE"  No results found for: "HGBA1C"  Urinalysis    Component Value Date/Time   APPEARANCEUR Cloudy (A) 08/03/2023 0901   GLUCOSEU Negative 08/03/2023 0901   BILIRUBINUR Negative 08/03/2023 0901   PROTEINUR 2+ (A) 08/03/2023 0901   UROBILINOGEN negative (A) 11/19/2019 1118   NITRITE Positive (A) 08/03/2023 0901   LEUKOCYTESUR 2+ (A) 08/03/2023 0901    Lab Results  Component Value Date   LABMICR See below: 08/03/2023   WBCUA >30 (A) 08/03/2023   LABEPIT 0-10 08/03/2023   MUCUS Present 10/26/2021   BACTERIA Many (A) 08/03/2023    Pertinent Imaging:  No results found for this or any previous visit.  No results found for this or any previous visit.  No results found for this or any previous visit.  No results found for this or any previous visit.  No results found for this or any previous visit.  No results found for this or any previous visit.  No results found for this or any previous visit.  No results found for this or any previous visit.   Assessment & Plan:    1.  Orchitis and epididymitis -doxycycline 100mg  BID for 14 days  3. Cellulitis of scrotum  - doxycycline (VIBRAMYCIN) 100 MG capsule; Take 1 capsule (100 mg total) by mouth every 12 (twelve) hours.  Dispense: 28 capsule; Refill: 0   No follow-ups on file.  [name], MD  [facility] [place]

## 2023-08-25 ENCOUNTER — Encounter: Payer: Self-pay | Admitting: Urology

## 2023-08-25 NOTE — Patient Instructions (Signed)
 Orchitis  Orchitis is inflammation of a testicle. Testicles are the male organs that produce sperm. The testicles are held in a fleshy sac (scrotum) located behind the penis. Orchitis usually affects only one testicle, but it can affect both. Orchitis is caused by infection. Many kinds of bacteria and viruses can cause this infection. The condition can develop suddenly. What are the causes? This condition may be caused by: Infection from viruses or bacteria. Other organisms, such as fungi or parasites. This is rare but can happen in men who have a weak body defense system (immune system), such as men who have HIV. Bacteria  Bacterial orchitis often occurs along with an infection of the tube that collects and stores sperm (epididymis). In men who are not sexually active, this infection usually starts as a urinary tract infection and spreads to the testicle. In sexually active men, sexually transmitted infections (STIs) are the most common cause of bacterial orchitis. These can include: Gonorrhea. Chlamydia. Viruses Mumps is the most common cause of viral orchitis, though mumps is now rare in many areas because of vaccination. Other viruses that can cause orchitis include: The chickenpox virus (varicella-zoster virus). The virus that causes mononucleosis (Epstein-Barr virus). What increases the risk? The following factors may make you more likely to develop this condition: For viral orchitis: Not having been vaccinated against mumps. For bacterial orchitis: Having had frequent urinary tract infections. Engaging in high-risk sexual behaviors, such as having multiple sexual partners or having sex without using a condom. Having a sexual partner with an STI. Having had urinary tract surgery. Using a tube that is passed through the penis to drain urine (Foley catheter). Having an enlarged prostate gland. What are the signs or symptoms? The most common symptoms of orchitis are swelling and  pain in the scrotum. Other signs and symptoms may include: Feeling generally sick (malaise). Fever and chills. Painful urination. Painful ejaculation. Headache. Fatigue. Nausea. Blood or discharge from the penis. Swollen lymph nodes in the groin area (inguinal nodes). How is this diagnosed? This condition may be diagnosed based on: Your symptoms. Your health care provider may suspect orchitis if you have a painful, swollen testicle along with other signs and symptoms of the condition. A physical exam. You may also have other tests, including: A blood test to check for signs of infection. A urine test to check for a urinary tract infection or STI. Using a swab to collect a fluid sample from the tip of the penis to test for STIs. Taking an image of the testicle using sound waves and a computer (testicular ultrasound). How is this treated? Treatment for this condition depends on the cause.  For bacterial orchitis, your health care provider may prescribe antibiotic medicines. Bacterial infections usually clear up within a few days. For both viral infections and bacterial infections, treatment may include: Rest. Anti-inflammatory medicines. Pain medicines. Raising (elevating) the scrotum with a towel or pillow and applying ice. Follow these instructions at home: Managing pain and swelling Elevate your scrotum and apply ice as directed. To do this: Put ice in a plastic bag. Place a small towel or pillow between your legs. Rest your scrotum on the pillow or towel. Place another towel between your skin and the plastic bag. Leave the ice on for 20 minutes, 2-3 times a day. Remove the ice if your skin turns bright red. This is very important. If you cannot feel pain, heat, or cold, you have a greater risk of damage to the area. General instructions  Rest as told by your health care provider. Take over-the-counter and prescription medicines only as told by your health care provider. If you  were prescribed an antibiotic medicine, take it as told by your health care provider. Do not stop taking the antibiotic even if you start to feel better. Do not have sex until your health care provider says it is okay to do so. Keep all follow-up visits. This is important. Contact a health care provider if: You have a fever. Pain and swelling have not gotten better after 3 days. Get help right away if: Your pain is getting worse. The swelling in your testicle gets worse. Summary Orchitis is inflammation of a testicle. It is caused by an infection from bacteria or a virus. The most common symptoms of orchitis are swelling and pain in the scrotum. Treatment for this condition depends on the cause. It may include medicines to fight the infection, reduce inflammation, and relieve the pain. Follow your health care provider's instructions about resting, icing, not having sex, and taking medicines. This information is not intended to replace advice given to you by your health care provider. Make sure you discuss any questions you have with your health care provider. Document Revised: 01/19/2021 Document Reviewed: 01/19/2021 Elsevier Patient Education  2024 [employer].

## 2023-08-30 ENCOUNTER — Encounter: Payer: Self-pay | Admitting: Urology

## 2023-08-30 ENCOUNTER — Ambulatory Visit: Payer: BC Managed Care – PPO | Admitting: Urology

## 2023-08-30 VITALS — BP 124/75 | HR 94

## 2023-08-30 DIAGNOSIS — N492 Inflammatory disorders of scrotum: Secondary | ICD-10-CM

## 2023-08-30 DIAGNOSIS — N453 Epididymo-orchitis: Secondary | ICD-10-CM | POA: Diagnosis not present

## 2023-08-30 MED ORDER — AMOXICILLIN-POT CLAVULANATE 875-125 MG PO TABS: 1.0000 | ORAL_TABLET | Freq: Two times a day (BID) | ORAL | 0 refills | Status: AC

## 2023-08-30 NOTE — Progress Notes (Signed)
 Name: Eric Andrade DOB: January 22, 1956 MRN: [id number]  History of Present Illness: Eric Andrade is a 68 y.o. male who presents today for follow up visit at [facility].  - GU history: 1. BPH with LUTS (incomplete bladder emptying). 2. Kidney stone(s). 3. Erectile dysfunction. 4. Hydrocele. Repaired surgically >5 years ago in [place].  At last visit with Dr. Sherrilee on 08/23/2023: Seen for follow up of bilateral orchitis / scrotal cellulitis. The plan was Doxycycline  100mg  BID for 14 days.  Today: He reports that last night he sat down and felt something sting on the right side of his scrotum. He applied some lotion. Later on he noticed pain and drainage coming from a hole on his scrotum which was clear at first and is now purulent / bloody. He reports that he has been taking Doxycycline  as prescribed. Denies fevers, chills, sweats. He reports that his scrotal swelling has been progressively decreasing since last office visit; denies localized redness or warmth. Denies any urinary concerns.     Fall Screening: Do you usually have a device to assist in your mobility? No   Medications: Current Outpatient Medications  Medication Sig Dispense Refill   acetaminophen  (TYLENOL ) 650 MG CR tablet Take 1,300 mg by mouth every 8 (eight) hours as needed for pain.     amoxicillin -clavulanate (AUGMENTIN ) 875-125 MG tablet Take 1 tablet by mouth every 12 (twelve) hours. 28 tablet 0   ciprofloxacin  (CIPRO ) 250 MG tablet Take 1 tablet (250 mg total) by mouth 2 (two) times daily. 10 tablet 0   diclofenac (VOLTAREN) 75 MG EC tablet Take 75 mg by mouth 2 (two) times daily as needed for moderate pain (pain score 4-6).     diphenhydrAMINE  (BENADRYL ) 25 MG tablet Take 25 mg by mouth daily as needed for allergies (Equate Brand).     oxyCODONE -acetaminophen  (PERCOCET) 5-325 MG tablet Take 1 tablet by mouth every 4 (four) hours as needed for severe pain (pain score 7-10). 15 tablet 0   senna  (SENOKOT) 8.6 MG TABS tablet Take 1 tablet (8.6 mg total) by mouth daily. 30 tablet 0   sildenafil  (VIAGRA ) 100 MG tablet TAKE 1 TABLET BY MOUTH ONCE DAILY AS NEEDED FOR ERECTILE DYSFUNCTION 30 tablet 0   albuterol  (VENTOLIN  HFA) 108 (90 Base) MCG/ACT inhaler Inhale 2 puffs into the lungs every 6 (six) hours as needed for wheezing or shortness of breath. (Patient not taking: Reported on 08/30/2023)     No current facility-administered medications for this visit.    Allergies: No Known Allergies  Past Medical History:  Diagnosis Date   Arthritis    BPH (benign prostatic hyperplasia)    Bronchitis    Past Surgical History:  Procedure Laterality Date   CIRCUMCISION     COLONOSCOPY WITH PROPOFOL  N/A 05/30/2022   Procedure: COLONOSCOPY WITH PROPOFOL ;  Surgeon: Cindie Carlin POUR, DO;  [facility];  Service: Endoscopy;  Laterality: N/A;  7:30 am   CYSTOSCOPY N/A 06/26/2023   Procedure: CYSTOSCOPY;  Surgeon: Sherrilee Belvie CROME, MD;  Location: [facility];  Service: Urology;  Laterality: N/A;   HERNIA REPAIR     umbilical   HYDROCELE EXCISION Left    LEG SURGERY Bilateral    POLYPECTOMY  05/30/2022   Procedure: POLYPECTOMY;  Surgeon: Cindie Carlin POUR, DO;  Location: [facility];  Service: Endoscopy;;   TRANSURETHRAL RESECTION OF PROSTATE N/A 06/26/2023   Procedure: TRANSURETHRAL RESECTION OF THE PROSTATE (TURP);  Surgeon: Sherrilee Belvie CROME, MD;  Location:  [facility];  Service: Urology;  Laterality: N/A;   WRIST SURGERY Right    Family History  Problem Relation Age of Onset   Colon cancer Brother        in his late 50s/early 53s   Social History   Socioeconomic History   Marital status: Divorced    Spouse name: Not on file   Number of children: Not on file   Years of education: Not on file   Highest education level: Not on file  Occupational History   Not on file  Tobacco Use   Smoking status: Never   Smokeless tobacco: Never  Substance and Sexual Activity   Alcohol use:  Yes    Comment: rare   Drug use: Not Currently   Sexual activity: Yes  Other Topics Concern   Not on file  Social History Narrative   Not on file   Social Drivers of Health   Financial Resource Strain: Not on file  Food Insecurity: No Food Insecurity (06/26/2023)   Hunger Vital Sign    Worried About Running Out of Food in the Last Year: Never true    Ran Out of Food in the Last Year: Never true  Transportation Needs: No Transportation Needs (06/26/2023)   PRAPARE - Administrator, Civil Service (Medical): No    Lack of Transportation (Non-Medical): No  Physical Activity: Not on file  Stress: Not on file  Social Connections: Not on file  Intimate Partner Violence: Not At Risk (06/26/2023)   Humiliation, Afraid, Rape, and Kick questionnaire    Fear of Current or Ex-Partner: No    Emotionally Abused: No    Physically Abused: No    Sexually Abused: No    Review of Systems Constitutional: Patient denies any unintentional weight loss or change in strength lntegumentary: Patient denies any rashes or pruritus Cardiovascular: Patient denies chest pain or syncope Respiratory: Patient denies shortness of breath Gastrointestinal: Patient denies nausea, vomiting, constipation, or diarrhea Musculoskeletal: Patient denies muscle cramps or weakness Neurologic: Patient denies convulsions or seizures Allergic/Immunologic: Patient denies recent allergic reaction(s) Hematologic/Lymphatic: Patient denies bleeding tendencies Endocrine: Patient denies heat/cold intolerance  GU: As per HPI.  OBJECTIVE Vitals:   08/30/23 0851  BP: 124/75  Pulse: 94   There is no height or weight on file to calculate BMI.  Physical Examination Constitutional: No obvious distress; patient is non-toxic appearing  Cardiovascular: No visible lower extremity edema.  Respiratory: The patient does not have audible wheezing/stridor; respirations do not appear labored  Gastrointestinal: Abdomen  non-distended Musculoskeletal: Normal ROM of UEs  Skin: No obvious rashes/open sores  Neurologic: CN 2-12 grossly intact Psychiatric: Answered questions appropriately with normal affect  Hematologic/Lymphatic/Immunologic: No obvious bruises or sites of spontaneous bleeding  Genitourinary: Penis is normal in appearance.  Right hemiscrotum indurated and edematous with purulent drainage through anterior opening.   ASSESSMENT / PLAN Orchitis and epididymitis - Plan: amoxicillin -clavulanate (AUGMENTIN ) 875-125 MG tablet, Ambulatory Referral For Surgery Scheduling  Scrotal abscess - Plan: amoxicillin -clavulanate (AUGMENTIN ) 875-125 MG tablet, Ambulatory Referral For Surgery Scheduling  Dr. Sherrilee was consulted and participated in assessment / planning today. Advised the following:  - I&D of scrotal abscess with possible right orchiectomy. Dr. Sherrilee submitted surgery request.  - Discontinue Doxycycline  and switch to Augmentin .  - Daily gentle cleansing with soap and water  only.  - Instructed not to squeeze or manually manipulate the scrotal tissue / drainage; keep covered with clean dry 4x4 gauze dressing tucked into supporting underwear.  He  was advised to go to the ER if He develops fever >100.5 F, uncontrollable pain, or other significantly concerning symptoms.  Pt verbalized understanding and agreement. All questions were answered.   Orders Placed This Encounter  Procedures   Ambulatory Referral For Surgery Scheduling    Referral Priority:   Urgent    Referral Type:   Consultation    Referred to Provider:   Sherrilee Belvie CROME, MD    Number of Visits Requested:   1    It has been explained that the patient is to follow regularly with their PCP in addition to all other providers involved in their care and to follow instructions provided by these respective offices. Patient advised to contact urology clinic if any urologic-pertaining questions, concerns, new symptoms or problems  arise in the interim period.  There are no Patient Instructions on file for this visit.  Electronically signed by:  Lauraine JAYSON Oz, FNP   08/30/23    9:34 AM

## 2023-08-30 NOTE — H&P (View-Only) (Signed)
 Name: Eric Andrade DOB: January 22, 1956 MRN: [id number]  History of Present Illness: Eric Andrade is a 68 y.o. male who presents today for follow up visit at [facility].  - GU history: 1. BPH with LUTS (incomplete bladder emptying). 2. Kidney stone(s). 3. Erectile dysfunction. 4. Hydrocele. Repaired surgically >5 years ago in [place].  At last visit with Dr. Sherrilee on 08/23/2023: Seen for follow up of bilateral orchitis / scrotal cellulitis. The plan was Doxycycline  100mg  BID for 14 days.  Today: He reports that last night he sat down and felt something sting on the right side of his scrotum. He applied some lotion. Later on he noticed pain and drainage coming from a hole on his scrotum which was clear at first and is now purulent / bloody. He reports that he has been taking Doxycycline  as prescribed. Denies fevers, chills, sweats. He reports that his scrotal swelling has been progressively decreasing since last office visit; denies localized redness or warmth. Denies any urinary concerns.     Fall Screening: Do you usually have a device to assist in your mobility? No   Medications: Current Outpatient Medications  Medication Sig Dispense Refill   acetaminophen  (TYLENOL ) 650 MG CR tablet Take 1,300 mg by mouth every 8 (eight) hours as needed for pain.     amoxicillin -clavulanate (AUGMENTIN ) 875-125 MG tablet Take 1 tablet by mouth every 12 (twelve) hours. 28 tablet 0   ciprofloxacin  (CIPRO ) 250 MG tablet Take 1 tablet (250 mg total) by mouth 2 (two) times daily. 10 tablet 0   diclofenac (VOLTAREN) 75 MG EC tablet Take 75 mg by mouth 2 (two) times daily as needed for moderate pain (pain score 4-6).     diphenhydrAMINE  (BENADRYL ) 25 MG tablet Take 25 mg by mouth daily as needed for allergies (Equate Brand).     oxyCODONE -acetaminophen  (PERCOCET) 5-325 MG tablet Take 1 tablet by mouth every 4 (four) hours as needed for severe pain (pain score 7-10). 15 tablet 0   senna  (SENOKOT) 8.6 MG TABS tablet Take 1 tablet (8.6 mg total) by mouth daily. 30 tablet 0   sildenafil  (VIAGRA ) 100 MG tablet TAKE 1 TABLET BY MOUTH ONCE DAILY AS NEEDED FOR ERECTILE DYSFUNCTION 30 tablet 0   albuterol  (VENTOLIN  HFA) 108 (90 Base) MCG/ACT inhaler Inhale 2 puffs into the lungs every 6 (six) hours as needed for wheezing or shortness of breath. (Patient not taking: Reported on 08/30/2023)     No current facility-administered medications for this visit.    Allergies: No Known Allergies  Past Medical History:  Diagnosis Date   Arthritis    BPH (benign prostatic hyperplasia)    Bronchitis    Past Surgical History:  Procedure Laterality Date   CIRCUMCISION     COLONOSCOPY WITH PROPOFOL  N/A 05/30/2022   Procedure: COLONOSCOPY WITH PROPOFOL ;  Surgeon: Cindie Carlin POUR, DO;  [facility];  Service: Endoscopy;  Laterality: N/A;  7:30 am   CYSTOSCOPY N/A 06/26/2023   Procedure: CYSTOSCOPY;  Surgeon: Sherrilee Belvie CROME, MD;  Location: [facility];  Service: Urology;  Laterality: N/A;   HERNIA REPAIR     umbilical   HYDROCELE EXCISION Left    LEG SURGERY Bilateral    POLYPECTOMY  05/30/2022   Procedure: POLYPECTOMY;  Surgeon: Cindie Carlin POUR, DO;  [facility]: [facility];  Service: Endoscopy;;   TRANSURETHRAL RESECTION OF PROSTATE N/A 06/26/2023   Procedure: TRANSURETHRAL RESECTION OF THE PROSTATE (TURP);  Surgeon: Sherrilee Belvie CROME, MD;  Location:  [facility];  Service: Urology;  Laterality: N/A;   WRIST SURGERY Right    Family History  Problem Relation Age of Onset   Colon cancer Brother        in his late 50s/early 53s   Social History   Socioeconomic History   Marital status: Divorced    Spouse name: Not on file   Number of children: Not on file   Years of education: Not on file   Highest education level: Not on file  Occupational History   Not on file  Tobacco Use   Smoking status: Never   Smokeless tobacco: Never  Substance and Sexual Activity   Alcohol use:  Yes    Comment: rare   Drug use: Not Currently   Sexual activity: Yes  Other Topics Concern   Not on file  Social History Narrative   Not on file   Social Drivers of Health   Financial Resource Strain: Not on file  Food Insecurity: No Food Insecurity (06/26/2023)   Hunger Vital Sign    Worried About Running Out of Food in the Last Year: Never true    Ran Out of Food in the Last Year: Never true  Transportation Needs: No Transportation Needs (06/26/2023)   PRAPARE - Administrator, Civil Service (Medical): No    Lack of Transportation (Non-Medical): No  Physical Activity: Not on file  Stress: Not on file  Social Connections: Not on file  Intimate Partner Violence: Not At Risk (06/26/2023)   Humiliation, Afraid, Rape, and Kick questionnaire    Fear of Current or Ex-Partner: No    Emotionally Abused: No    Physically Abused: No    Sexually Abused: No    Review of Systems Constitutional: Patient denies any unintentional weight loss or change in strength lntegumentary: Patient denies any rashes or pruritus Cardiovascular: Patient denies chest pain or syncope Respiratory: Patient denies shortness of breath Gastrointestinal: Patient denies nausea, vomiting, constipation, or diarrhea Musculoskeletal: Patient denies muscle cramps or weakness Neurologic: Patient denies convulsions or seizures Allergic/Immunologic: Patient denies recent allergic reaction(s) Hematologic/Lymphatic: Patient denies bleeding tendencies Endocrine: Patient denies heat/cold intolerance  GU: As per HPI.  OBJECTIVE Vitals:   08/30/23 0851  BP: 124/75  Pulse: 94   There is no height or weight on file to calculate BMI.  Physical Examination Constitutional: No obvious distress; patient is non-toxic appearing  Cardiovascular: No visible lower extremity edema.  Respiratory: The patient does not have audible wheezing/stridor; respirations do not appear labored  Gastrointestinal: Abdomen  non-distended Musculoskeletal: Normal ROM of UEs  Skin: No obvious rashes/open sores  Neurologic: CN 2-12 grossly intact Psychiatric: Answered questions appropriately with normal affect  Hematologic/Lymphatic/Immunologic: No obvious bruises or sites of spontaneous bleeding  Genitourinary: Penis is normal in appearance.  Right hemiscrotum indurated and edematous with purulent drainage through anterior opening.   ASSESSMENT / PLAN Orchitis and epididymitis - Plan: amoxicillin -clavulanate (AUGMENTIN ) 875-125 MG tablet, Ambulatory Referral For Surgery Scheduling  Scrotal abscess - Plan: amoxicillin -clavulanate (AUGMENTIN ) 875-125 MG tablet, Ambulatory Referral For Surgery Scheduling  Dr. Sherrilee was consulted and participated in assessment / planning today. Advised the following:  - I&D of scrotal abscess with possible right orchiectomy. Dr. Sherrilee submitted surgery request.  - Discontinue Doxycycline  and switch to Augmentin .  - Daily gentle cleansing with soap and water  only.  - Instructed not to squeeze or manually manipulate the scrotal tissue / drainage; keep covered with clean dry 4x4 gauze dressing tucked into supporting underwear.  He  was advised to go to the ER if He develops fever >100.5 F, uncontrollable pain, or other significantly concerning symptoms.  Pt verbalized understanding and agreement. All questions were answered.   Orders Placed This Encounter  Procedures   Ambulatory Referral For Surgery Scheduling    Referral Priority:   Urgent    Referral Type:   Consultation    Referred to Provider:   Sherrilee Belvie CROME, MD    Number of Visits Requested:   1    It has been explained that the patient is to follow regularly with their PCP in addition to all other providers involved in their care and to follow instructions provided by these respective offices. Patient advised to contact urology clinic if any urologic-pertaining questions, concerns, new symptoms or problems  arise in the interim period.  There are no Patient Instructions on file for this visit.  Electronically signed by:  Lauraine JAYSON Oz, FNP   08/30/23    9:34 AM

## 2023-08-31 ENCOUNTER — Encounter (HOSPITAL_COMMUNITY)
Admission: RE | Admit: 2023-08-31 | Discharge: 2023-08-31 | Disposition: A | Discharge status: Home / Self Care | Payer: BC Managed Care – PPO | Source: Ambulatory Visit | Attending: Urology | Admitting: Urology

## 2023-09-04 ENCOUNTER — Other Ambulatory Visit: Payer: Self-pay

## 2023-09-04 ENCOUNTER — Telehealth: Payer: Self-pay

## 2023-09-04 ENCOUNTER — Ambulatory Visit (HOSPITAL_COMMUNITY): Payer: BC Managed Care – PPO | Admitting: Anesthesiology

## 2023-09-04 ENCOUNTER — Ambulatory Visit (HOSPITAL_COMMUNITY)
Admission: RE | Admit: 2023-09-04 | Discharge: 2023-09-04 | Disposition: A | Discharge status: Home / Self Care | Payer: BC Managed Care – PPO | Source: Ambulatory Visit | Attending: Urology | Admitting: Urology

## 2023-09-04 ENCOUNTER — Encounter (HOSPITAL_COMMUNITY)
Admission: RE | Disposition: A | Discharge status: Home / Self Care | Payer: Self-pay | Source: Ambulatory Visit | Attending: Urology

## 2023-09-04 ENCOUNTER — Encounter (HOSPITAL_COMMUNITY): Payer: Self-pay | Admitting: Urology

## 2023-09-04 DIAGNOSIS — N453 Epididymo-orchitis: Secondary | ICD-10-CM | POA: Insufficient documentation

## 2023-09-04 DIAGNOSIS — N501 Vascular disorders of male genital organs: Secondary | ICD-10-CM

## 2023-09-04 DIAGNOSIS — N509 Disorder of male genital organs, unspecified: Secondary | ICD-10-CM | POA: Diagnosis present

## 2023-09-04 HISTORY — PX: INCISION AND DRAINAGE ABSCESS: SHX5864

## 2023-09-04 HISTORY — PX: ORCHIECTOMY: SHX2116

## 2023-09-04 SURGERY — INCISION AND DRAINAGE, ABSCESS
Anesthesia: General | Site: Scrotum | Laterality: Right

## 2023-09-04 MED ORDER — DEXAMETHASONE SODIUM PHOSPHATE 10 MG/ML IJ SOLN
INTRAMUSCULAR | Status: DC | PRN
  Administered 2023-09-04: 8 mg via INTRAVENOUS

## 2023-09-04 MED ORDER — OXYCODONE-ACETAMINOPHEN 5-325 MG PO TABS: 1.0000 | ORAL_TABLET | ORAL | 0 refills | Status: AC | PRN

## 2023-09-04 MED ORDER — PROPOFOL 10 MG/ML IV BOLUS
INTRAVENOUS | Status: AC
  Filled 2023-09-04: qty 20

## 2023-09-04 MED ORDER — FENTANYL CITRATE PF 50 MCG/ML IJ SOSY: 25.0000 ug | PREFILLED_SYRINGE | INTRAMUSCULAR | Status: DC | PRN

## 2023-09-04 MED ORDER — PROPOFOL 10 MG/ML IV BOLUS
INTRAVENOUS | Status: DC | PRN
  Administered 2023-09-04: 150 mg via INTRAVENOUS

## 2023-09-04 MED ORDER — PHENYLEPHRINE 80 MCG/ML (10ML) SYRINGE FOR IV PUSH (FOR BLOOD PRESSURE SUPPORT)
PREFILLED_SYRINGE | INTRAVENOUS | Status: DC | PRN
  Administered 2023-09-04 (×2): 160 ug via INTRAVENOUS

## 2023-09-04 MED ORDER — KETOROLAC TROMETHAMINE 30 MG/ML IJ SOLN
INTRAMUSCULAR | Status: DC | PRN
  Administered 2023-09-04: 30 mg via INTRAVENOUS

## 2023-09-04 MED ORDER — LIDOCAINE HCL (CARDIAC) PF 100 MG/5ML IV SOSY
PREFILLED_SYRINGE | INTRAVENOUS | Status: DC | PRN
  Administered 2023-09-04: 75 mg via INTRATRACHEAL

## 2023-09-04 MED ORDER — OXYCODONE HCL 5 MG PO TABS
5.0000 mg | ORAL_TABLET | Freq: Once | ORAL | Status: AC | PRN
  Administered 2023-09-04: 5 mg via ORAL
  Filled 2023-09-04: qty 1

## 2023-09-04 MED ORDER — LACTATED RINGERS IV SOLN: INTRAVENOUS | Status: DC

## 2023-09-04 MED ORDER — CHLORHEXIDINE GLUCONATE 0.12 % MT SOLN
15.0000 mL | Freq: Once | OROMUCOSAL | Status: AC
  Administered 2023-09-04: 15 mL via OROMUCOSAL

## 2023-09-04 MED ORDER — LIDOCAINE HCL (PF) 2 % IJ SOLN
INTRAMUSCULAR | Status: AC
  Filled 2023-09-04: qty 5

## 2023-09-04 MED ORDER — ONDANSETRON HCL 4 MG/2ML IJ SOLN
INTRAMUSCULAR | Status: DC | PRN
  Administered 2023-09-04: 4 mg via INTRAVENOUS

## 2023-09-04 MED ORDER — OXYCODONE HCL 5 MG/5ML PO SOLN: 5.0000 mg | Freq: Once | ORAL | Status: AC | PRN

## 2023-09-04 MED ORDER — FENTANYL CITRATE (PF) 100 MCG/2ML IJ SOLN
INTRAMUSCULAR | Status: AC
  Filled 2023-09-04: qty 2

## 2023-09-04 MED ORDER — ONDANSETRON HCL 4 MG/2ML IJ SOLN
4.0000 mg | Freq: Once | INTRAMUSCULAR | Status: AC | PRN
  Administered 2023-09-04: 4 mg via INTRAVENOUS
  Filled 2023-09-04: qty 2

## 2023-09-04 MED ORDER — CEFAZOLIN SODIUM-DEXTROSE 2-4 GM/100ML-% IV SOLN
2.0000 g | INTRAVENOUS | Status: AC
  Administered 2023-09-04: 2 g via INTRAVENOUS
  Filled 2023-09-04: qty 100

## 2023-09-04 MED ORDER — ROCURONIUM BROMIDE 100 MG/10ML IV SOLN
INTRAVENOUS | Status: DC | PRN
  Administered 2023-09-04: 50 mg via INTRAVENOUS

## 2023-09-04 MED ORDER — ORAL CARE MOUTH RINSE: 15.0000 mL | Freq: Once | OROMUCOSAL | Status: AC

## 2023-09-04 MED ORDER — 0.9 % SODIUM CHLORIDE (POUR BTL) OPTIME
TOPICAL | Status: DC | PRN
  Administered 2023-09-04: 1000 mL

## 2023-09-04 MED ORDER — SUGAMMADEX SODIUM 200 MG/2ML IV SOLN
INTRAVENOUS | Status: DC | PRN
  Administered 2023-09-04: 200 mg via INTRAVENOUS

## 2023-09-04 MED ORDER — MIDAZOLAM HCL 2 MG/2ML IJ SOLN
INTRAMUSCULAR | Status: AC
  Filled 2023-09-04: qty 2

## 2023-09-04 MED ORDER — PHENYLEPHRINE 80 MCG/ML (10ML) SYRINGE FOR IV PUSH (FOR BLOOD PRESSURE SUPPORT)
PREFILLED_SYRINGE | INTRAVENOUS | Status: AC
  Filled 2023-09-04: qty 10

## 2023-09-04 MED ORDER — FENTANYL CITRATE (PF) 100 MCG/2ML IJ SOLN
INTRAMUSCULAR | Status: DC | PRN
  Administered 2023-09-04: 75 ug via INTRAVENOUS
  Administered 2023-09-04: 25 ug via INTRAVENOUS

## 2023-09-04 MED ORDER — BUPIVACAINE HCL (PF) 0.25 % IJ SOLN
INTRAMUSCULAR | Status: AC
  Filled 2023-09-04: qty 30

## 2023-09-04 SURGICAL SUPPLY — 37 items
BNDG GAUZE DERMACEA FLUFF 4 (GAUZE/BANDAGES/DRESSINGS) IMPLANT
BNDG GAUZE ELAST 4 BULKY (GAUZE/BANDAGES/DRESSINGS) IMPLANT
COVER LIGHT HANDLE STERIS (MISCELLANEOUS) ×4 IMPLANT
DECANTER SPIKE VIAL GLASS SM (MISCELLANEOUS) ×2 IMPLANT
DERMABOND ADVANCED .7 DNX12 (GAUZE/BANDAGES/DRESSINGS) ×2 IMPLANT
DRAIN PENROSE 0.5X18 (DRAIN) ×2 IMPLANT
ELECT NDL TIP 2.8 STRL (NEEDLE) IMPLANT
ELECT NEEDLE TIP 2.8 STRL (NEEDLE) ×2 IMPLANT
ELECT REM PT RETURN 9FT ADLT (ELECTROSURGICAL) ×2 IMPLANT
ELECTRODE REM PT RTRN 9FT ADLT (ELECTROSURGICAL) ×2 IMPLANT
GAUZE SPONGE 4X4 12PLY STRL (GAUZE/BANDAGES/DRESSINGS) ×2 IMPLANT
GLOVE BIO SURGEON STRL SZ8 (GLOVE) ×2 IMPLANT
GLOVE BIOGEL PI IND STRL 7.0 (GLOVE) ×4 IMPLANT
GLOVE BIOGEL PI IND STRL 8 (GLOVE) ×2 IMPLANT
GLOVE BIOGEL PI IND STRL 8.5 (GLOVE) ×2 IMPLANT
GOWN STRL REUS W/TWL LRG LVL3 (GOWN DISPOSABLE) ×2 IMPLANT
GOWN STRL REUS W/TWL XL LVL3 (GOWN DISPOSABLE) ×2 IMPLANT
KIT TURNOVER KIT A (KITS) ×2 IMPLANT
MANIFOLD NEPTUNE II (INSTRUMENTS) ×2 IMPLANT
NDL HYPO 25X1 1.5 SAFETY (NEEDLE) ×2 IMPLANT
NEEDLE HYPO 25X1 1.5 SAFETY (NEEDLE) ×2 IMPLANT
PACK MINOR (CUSTOM PROCEDURE TRAY) ×2 IMPLANT
PAD ARMBOARD 7.5X6 YLW CONV (MISCELLANEOUS) ×2 IMPLANT
PENCIL SMOKE EVACUATOR (MISCELLANEOUS) ×2 IMPLANT
POSITIONER HEAD 8X9X4 ADT (SOFTGOODS) ×2 IMPLANT
SET BASIN LINEN APH (SET/KITS/TRAYS/PACK) ×2 IMPLANT
SOL PREP POV-IOD 4OZ 10% (MISCELLANEOUS) ×2 IMPLANT
SOL PREP PROV IODINE SCRUB 4OZ (MISCELLANEOUS) ×2 IMPLANT
SUPPORT SCROTAL LG STRP (MISCELLANEOUS) ×2 IMPLANT
SUPPORTER AHLETIC TETRA LG (SOFTGOODS) IMPLANT
SUT CHROMIC 3 0 SH 27 (SUTURE) IMPLANT
SUT SILK 2-0 18XBRD TIE 12 (SUTURE) ×2 IMPLANT
SUT VIC AB 2-0 SH 27X BRD (SUTURE) ×2 IMPLANT
SUT VIC AB 3-0 SH 27X BRD (SUTURE) IMPLANT
SUT VICRYL AB 2 0 TIES (SUTURE) IMPLANT
SYR BULB IRRIG 60ML STRL (SYRINGE) ×2 IMPLANT
SYR CONTROL 10ML LL (SYRINGE) ×2 IMPLANT

## 2023-09-04 NOTE — Anesthesia Procedure Notes (Signed)
 Procedure Name: Intubation Date/Time: 09/04/2023 7:58 AM  Performed by: Leeanne Puffer, CRNAPre-anesthesia Checklist: Patient identified, Patient being monitored, Timeout performed, Emergency Drugs available and Suction available Patient Re-evaluated:Patient Re-evaluated prior to induction Oxygen Delivery Method: Circle system utilized Preoxygenation: Pre-oxygenation with 100% oxygen Induction Type: IV induction Ventilation: Mask ventilation without difficulty Laryngoscope Size: Mac and 3 Grade View: Grade I Tube type: Oral Tube size: 7.0 mm Number of attempts: 1 Airway Equipment and Method: Stylet Placement Confirmation: ETT inserted through vocal cords under direct vision, positive ETCO2 and breath sounds checked- equal and bilateral Secured at: 21 cm Tube secured with: Tape Dental Injury: Teeth and Oropharynx as per pre-operative assessment

## 2023-09-04 NOTE — Op Note (Signed)
Preoperative diagnosis: right testicular Mass.  Postoperative diagnosis: Same  Procedure: Incision and drainage of scrotal abscess Right simple orchiectomy  Attending: [name], MD  Anesthesia: General  History of blood loss: Minimal  Antibiotics: ancef  Drains: penrose  Specimens: right testis and partial spermatic cord   Findings: right scrotal abscess drained. 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  We discussed the treatment options including incision and drainage of scrotal abscess and orchiectomy after discussing treatment options he decided to proceed with surgery.   Procedure in detail: Prior to procedure consent was obtained.  Patient was brought to the operating room and a brief timeout was done to ensure correct patient, correct procedure, correct site.  General anesthesia was administered and patient was placed in supine position.  His genitalia and abdomen was then prepped and draped in usual sterile fashion.  A 5 cm incision was made in the right hemiscrotum.  We dissected down through the subcutaneous tissue to until we reached the spermatic cord.  We then dissected to the right testis and noted the tunica was ruptured with necrotic seminiferous tubules.  We then proceeded to bluntly and sharply dissected the attachments of the testicle to the scrotum.  Once this was done the testicle was then brought into the operative field.  We then proceeded to dissected the gubernaculum with electrocautery.  Once the testicle was freed from its attachments were then turned our attention the spermatic cord.  We separated the spermatic cord into 3 distinct packets.  The packets were then ligated with 2-0 vicryl ties.  Once this was done we then sharply cut the spermatic cord and the spot testicle  was then sent for pathology.  We then inspected the scrotum in the operative bed and we noted no residual bleeding.  We then elected to place a quarter-inch Penrose drain.  The Penrose brought through the dependent aspect of the incision.   We then attached the Penrose with a vicryl stitch.  We then closed the subcutaneous tissues in 2 layers with 2-0 Vicryl in a running fashion.  We then loosely closed the skin with  3-0 vicryl in an interrupted fashion.  A dressing was then applied to the incision and to the Penrose drain.  We then placed a scrotal fluff and this then concluded the procedure which was well tolerated by the patient.  Complications: None  Condition: Stable, extubated, transferred to PACU.  Plan: Patient is to be discharged home.  He is to follow up in 2 weeks for wound check and drain removal.

## 2023-09-04 NOTE — Telephone Encounter (Signed)
 PA started: Key: [id number]

## 2023-09-04 NOTE — Transfer of Care (Signed)
 Immediate Anesthesia Transfer of Care Note  Patient: Eric Andrade  Procedure(s) Performed: INCISION AND DRAINAGE ABSCESS- Scrotal abcess (Scrotum) SIMPLE ORCHIECTOMY (Right: Scrotum)  Patient Location: PACU  Anesthesia Type:General  Level of Consciousness: awake  Airway & Oxygen Therapy: Patient Spontanous Breathing  Post-op Assessment: Report given to RN  Post vital signs: Reviewed and stable  Last Vitals:  Vitals Value Taken Time  BP 143/73 09/04/23 0857  Temp 36.4 C 09/04/23 0857  Pulse 55 09/04/23 0857  Resp 14 09/04/23 0857  SpO2 93 % 09/04/23 0857  Vitals shown include unfiled device data.  Last Pain:  Vitals:   09/04/23 0650  TempSrc: Oral  PainSc: 0-No pain      Patients Stated Pain Goal: 5 (09/04/23 0650)  Complications: No notable events documented.

## 2023-09-04 NOTE — Anesthesia Preprocedure Evaluation (Signed)
 Anesthesia Evaluation  Patient identified by MRN, date of birth, ID band Patient awake    Reviewed: Allergy & Precautions, H&P , NPO status , Patient's Chart, lab work & pertinent test results  History of Anesthesia Complications (+) history of anesthetic complications (aspiration during colonoscopy)  Airway Mallampati: I  TM Distance: >3 FB Neck ROM: Full    Dental  (+) Edentulous Upper, Edentulous Lower   Pulmonary  History aspiration during colonscopy   Pulmonary exam normal breath sounds clear to auscultation       Cardiovascular Exercise Tolerance: Good negative cardio ROS Normal cardiovascular exam Rhythm:Regular Rate:Normal     Neuro/Psych negative neurological ROS  negative psych ROS   GI/Hepatic negative GI ROS, Neg liver ROS,,,  Endo/Other  negative endocrine ROS    Renal/GU negative Renal ROS Bladder dysfunction (BPH)      Musculoskeletal  (+) Arthritis , Osteoarthritis,    Abdominal Normal abdominal exam  (+)   Peds negative pediatric ROS (+)  Hematology negative hematology ROS (+)   Anesthesia Other Findings   Reproductive/Obstetrics negative OB ROS                             Anesthesia Physical Anesthesia Plan  ASA: 2  Anesthesia Plan: General   Post-op Pain Management: Dilaudid IV   Induction: Intravenous  PONV Risk Score and Plan: 2 and Ondansetron, Dexamethasone and Midazolam  Airway Management Planned: Oral ETT  Additional Equipment: None  Intra-op Plan:   Post-operative Plan:   Informed Consent: I have reviewed the patients History and Physical, chart, labs and discussed the procedure including the risks, benefits and alternatives for the proposed anesthesia with the patient or authorized representative who has indicated his/her understanding and acceptance.     Dental advisory given  Plan Discussed with: CRNA and Surgeon  Anesthesia Plan  Comments:        Anesthesia Quick Evaluation

## 2023-09-04 NOTE — Anesthesia Postprocedure Evaluation (Signed)
 Anesthesia Post Note  Patient: Eric Andrade  Procedure(s) Performed: INCISION AND DRAINAGE ABSCESS- Scrotal abcess (Scrotum) SIMPLE ORCHIECTOMY (Right: Scrotum)  Patient location during evaluation: PACU Anesthesia Type: General Level of consciousness: awake and alert Pain management: pain level controlled Vital Signs Assessment: post-procedure vital signs reviewed and stable Respiratory status: spontaneous breathing, nonlabored ventilation, respiratory function stable and patient connected to nasal cannula oxygen Cardiovascular status: blood pressure returned to baseline and stable Postop Assessment: no apparent nausea or vomiting Anesthetic complications: no   No notable events documented.   Last Vitals:  Vitals:   09/04/23 0930 09/04/23 0945  BP: 125/88 134/86  Pulse: 63 60  Resp: 11 12  Temp:  (!) 36.3 C  SpO2: 94% 92%    Last Pain:  Vitals:   09/04/23 0934  TempSrc:   PainSc: 5                  [name]

## 2023-09-04 NOTE — Discharge Instructions (Signed)
 Follow up with Dr. Claretta Croft in 2 weeks for wound check and drain removal.  Appointment already has been made.

## 2023-09-04 NOTE — Interval H&P Note (Signed)
 History and Physical Interval Note:  09/04/2023 7:35 AM  Eric Andrade  has presented today for surgery, with the diagnosis of Scrotal abscess.  The various methods of treatment have been discussed with the patient and family. After consideration of risks, benefits and other options for treatment, the patient has consented to  Procedure(s): INCISION AND DRAINAGE ABSCESS- Scrotal abcess (N/A) ORCHIECTOMY- possible (Bilateral) as a surgical intervention.  The patient's history has been reviewed, patient examined, no change in status, stable for surgery.  I have reviewed the patient's chart and labs.  Questions were answered to the patient's satisfaction.     Johnie Nailer

## 2023-09-05 ENCOUNTER — Encounter (HOSPITAL_COMMUNITY): Payer: Self-pay | Admitting: Urology

## 2023-09-05 LAB — SURGICAL PATHOLOGY

## 2023-09-07 ENCOUNTER — Telehealth: Payer: Self-pay

## 2023-09-07 NOTE — Telephone Encounter (Signed)
Left vm informing pt that he will need to come by the office to sign another FMLA form that was faxed over by employer.  Office hours provided and direct call back.

## 2023-09-08 ENCOUNTER — Telehealth: Payer: Self-pay

## 2023-09-08 NOTE — Telephone Encounter (Signed)
08/23/2023 office notes faxed to [employer] at [phone].  Waiting on patient and MD signature before sending final documentation due on 09/15/2023.  Patient is aware his signature is needed, he does not have a ride to the office this week, pt states he will try to have someone bring him by Monday or Wednesday of next week.  MD will be back in office on Wednesday.

## 2023-09-11 NOTE — Telephone Encounter (Signed)
 I called patient to see if he will come by sooner to sign his FMLA paper work so I can fax it back before 02/21 due to the inclement weather, pt has to arrange transportation. He will try to call his insurance to request an extension, he is unable to come any sooner.

## 2023-09-14 ENCOUNTER — Encounter: Payer: Self-pay | Admitting: Urology

## 2023-09-14 NOTE — Telephone Encounter (Signed)
 Patient's return to work form was completed and faxed back to [employer] at [phone]-  Form will be scanned under media.  I tried to call to confirm they had received all of the forms needed.  No answer left a voicemail and direct call back number provided.

## 2023-09-14 NOTE — Telephone Encounter (Signed)
 Patient called to inform our office he is unable to get a ride to sign FMLA paper work that was faxed on 08/30/2023.  He states he contacted his insurance and they stated they did not need anymore paper work they had everything they needed.  I had previously been in touch with Bonita Quin with [employer] regarding the need for more information on why patient's date needed to be extended to 10/05/2023.  I faxed over office notes from 08/03/2023-08/30/2023 with documentation of patient's need for a second surgery requiring an extension of leave until 10/05/2023.  Release: [id number]

## 2023-09-19 ENCOUNTER — Encounter: Payer: Medicare HMO | Admitting: Urology

## 2023-09-19 DIAGNOSIS — Z9079 Acquired absence of other genital organ(s): Secondary | ICD-10-CM | POA: Insufficient documentation

## 2023-09-19 NOTE — Progress Notes (Deleted)
 Name: Eric Andrade DOB: July 13, 1956 MRN: [id number]  Diagnoses: Post-operative state  HPI: He presents postoperatively. - GU history includes:  1. BPH with LUTS (incomplete bladder emptying). 2. Kidney stone(s). 3. Erectile dysfunction. 4. Hydrocele. Repaired surgically >5 years ago in [place].  He underwent the following procedures by Dr. Ronne Binning on 09/04/2023:  Preoperative diagnosis: Right testicular mass   Postoperative diagnosis: Same   Procedure:  1. Incision and drainage of scrotal abscess 2. Right simple orchiectomy  Pathology:  A. 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  He {Actions; denies-reports:120008} fevers.  He {Actions; denies-reports:120008} increased urinary urgency, frequency, nocturia, dysuria, gross hematuria, hesitancy, straining to void, or sensations of incomplete emptying.   Fall Screening: Do you usually have a device to assist in your mobility? {yes/no:20286} ***cane / ***walker / ***wheelchair   Medications: Current Outpatient Medications  Medication Sig Dispense Refill   acetaminophen (TYLENOL) 650 MG CR tablet Take 1,300 mg by mouth every 8 (eight) hours as needed for pain.     albuterol (VENTOLIN HFA) 108 (90 Base) MCG/ACT inhaler Inhale 2 puffs into the lungs every 6 (six) hours as needed for wheezing or shortness of breath. (Patient not taking: Reported on 08/30/2023)     amoxicillin-clavulanate (AUGMENTIN) 875-125 MG tablet Take 1 tablet by mouth every 12 (twelve) hours. 28 tablet 0   ciprofloxacin (CIPRO) 250 MG tablet Take 1 tablet (250 mg total) by mouth 2 (two) times daily. 10 tablet 0   diclofenac (VOLTAREN) 75 MG EC tablet  Take 75 mg by mouth 2 (two) times daily as needed for moderate pain (pain score 4-6).     diphenhydrAMINE (BENADRYL) 25 MG tablet Take 25 mg by mouth daily as needed for allergies (Equate Brand).     oxyCODONE-acetaminophen (PERCOCET) 5-325 MG tablet Take 1 tablet by mouth every 4 (four) hours as needed for severe pain (pain score 7-10). 30 tablet 0   senna (SENOKOT) 8.6 MG TABS tablet Take 1 tablet (8.6 mg total) by mouth daily. 30 tablet 0   sildenafil (VIAGRA) 100 MG tablet TAKE 1 TABLET BY MOUTH ONCE DAILY AS NEEDED FOR ERECTILE DYSFUNCTION 30 tablet 0   No current facility-administered medications for this visit.    Allergies: No Known Allergies  Past Medical History:  Diagnosis Date   Arthritis    BPH (benign prostatic hyperplasia)    Bronchitis    Past Surgical History:  Procedure Laterality Date   CIRCUMCISION     COLONOSCOPY WITH PROPOFOL N/A 05/30/2022   Procedure: COLONOSCOPY WITH PROPOFOL;  Surgeon: Lanelle Bal, DO;  [facility];  Service: Endoscopy;  Laterality: N/A;  7:30 am   CYSTOSCOPY N/A 06/26/2023   Procedure: CYSTOSCOPY;  Surgeon: Malen Gauze, MD;  Location: [facility];  Service: Urology;  Laterality: N/A;   HERNIA REPAIR     umbilical   HYDROCELE EXCISION Left    INCISION AND DRAINAGE ABSCESS N/A 09/04/2023   Procedure: INCISION AND DRAINAGE ABSCESS- Scrotal abcess;  Surgeon: Malen Gauze, MD;  Location: [facility];  Service: Urology;  Laterality: N/A;   LEG SURGERY Bilateral    ORCHIECTOMY Right 09/04/2023   Procedure: SIMPLE ORCHIECTOMY;  Surgeon: Malen Gauze, MD;  Location: [facility];  Service: Urology;  Laterality: Right;   POLYPECTOMY  05/30/2022   Procedure: POLYPECTOMY;  Surgeon: Lanelle Bal, DO;  [facility];  Service: Endoscopy;;   TRANSURETHRAL RESECTION OF PROSTATE N/A 06/26/2023   Procedure: TRANSURETHRAL RESECTION OF THE PROSTATE (TURP);  Surgeon: Malen Gauze, MD;  Location: [facility];  Service:  Urology;  Laterality: N/A;   WRIST SURGERY Right    Family History  Problem Relation Age of Onset   Colon cancer Brother        in his late 50s/early 27s   Social History   Socioeconomic History   Marital status: Divorced    Spouse name: Not on file   Number of children: Not on file   Years of education: Not on file   Highest education level: Not on file  Occupational History   Not on file  Tobacco Use   Smoking status: Never   Smokeless tobacco: Never  Substance and Sexual Activity   Alcohol use: Yes    Comment: rare   Drug use: Not Currently   Sexual activity: Yes  Other Topics Concern   Not on file  Social History Narrative   Not on file   Social Drivers of Health   Financial Resource Strain: Not on file  Food Insecurity: No Food Insecurity (06/26/2023)   Hunger Vital Sign    Worried About Running Out of Food in the Last Year: Never true    Ran Out of Food in the Last Year: Never true  Transportation Needs: No Transportation Needs (06/26/2023)   PRAPARE - Administrator, Civil Service (Medical): No    Lack of Transportation (Non-Medical): No  Physical Activity: Not on file  Stress: Not on file  Social Connections: Not on file  Intimate Partner Violence: Not At Risk (06/26/2023)   Humiliation, Afraid, Rape, and Kick questionnaire    Fear of Current or Ex-Partner: No    Emotionally Abused: No    Physically Abused: No    Sexually Abused: No    SUBJECTIVE  Review of Systems Constitutional: Patient denies any unintentional weight loss or change in strength lntegumentary: Patient denies any rashes or pruritus Cardiovascular: Patient denies chest pain or syncope Respiratory: Patient denies shortness of breath Gastrointestinal: Patient ***denies nausea, vomiting, constipation, or diarrhea Musculoskeletal: Patient denies muscle cramps or weakness Neurologic: Patient denies convulsions or seizures Allergic/Immunologic: Patient denies recent allergic  reaction(s) Hematologic/Lymphatic: Patient denies bleeding tendencies Endocrine: Patient denies heat/cold intolerance  GU: As per HPI.  OBJECTIVE There were no vitals filed for this visit. There is no height or weight on file to calculate BMI.  Physical Examination Constitutional: No obvious distress; patient is non-toxic appearing  Cardiovascular: No visible lower extremity edema.  Respiratory: The patient does not have audible wheezing/stridor; respirations do not appear labored  Gastrointestinal: Abdomen non-distended Musculoskeletal: Normal ROM of UEs  Skin: No obvious rashes/open sores  Neurologic: CN 2-12 grossly intact Psychiatric: Answered questions appropriately with normal affect  Hematologic/Lymphatic/Immunologic: No obvious bruises or sites of spontaneous bleeding  GU: ***Incision well healed, intact, with no surrounding erythema, edema, crepitus, fluctuance, drainage / discharge, warmth, significant tenderness to palpation.   UA:  ***positive for *** leukocytes, *** blood, ***nitrites ***Urine microscopy:  ***negative  *** WBC/hpf, *** RBC/hpf, *** bacteria ***with no evidence of UTI ***with no evidence of microscopic hematuria ***otherwise unremarkable ***glucosuria (secondary to ***Jardiance ***Farxiga use)  PVR: *** ml  ASSESSMENT No diagnosis found. *** We reviewed the operative procedures and findings. Pre-operative symptoms are ***  since the procedure. Surgical site healing ***well. Pain is ***well controlled. *** removed; patient tolerated ***well.  We agreed to plan for follow up in *** months or sooner if needed. Patient verbalized understanding of and agreement with current plan. All questions were answered.  PLAN Advised the following: *** ***No follow-ups on file.  ***No global period (charge regular E&M) - Cryotherapy - Cystoscopy w/ bladder Botox - Cystoscopy w/ bladder biopsy - Cystoscopy w/ retrograde pyelogram - Cystoscopy w/  hydrodistention - Cystoscopy w/ ureteral stent placement - Cystolitholapaxy - Ureteroscopy w/ ureteral stent placement - Ureteroscopy w/ stone removal - Urolift - TURBT - Prostate biopsy  ***10 day global period: - Circumcision  ***Everything else pretty much is 90 day global period (including TURP)  No orders of the defined types were placed in this encounter.   It has been explained that the patient is to follow regularly with their PCP in addition to all other providers involved in their care and to follow instructions provided by these respective offices. Patient advised to contact urology clinic if any urologic-pertaining questions, concerns, new symptoms or problems arise in the interim period.  There are no Patient Instructions on file for this visit.  Electronically signed by:  Donnita Falls, MSN, FNP-C, CUNP 09/19/2023 8:47 AM

## 2023-09-22 ENCOUNTER — Encounter: Payer: Self-pay | Admitting: Urology

## 2023-09-22 ENCOUNTER — Telehealth: Payer: Self-pay

## 2023-09-22 ENCOUNTER — Ambulatory Visit: Payer: Medicare HMO | Admitting: Urology

## 2023-09-22 VITALS — BP 114/74 | HR 87

## 2023-09-22 DIAGNOSIS — Z09 Encounter for follow-up examination after completed treatment for conditions other than malignant neoplasm: Secondary | ICD-10-CM

## 2023-09-22 DIAGNOSIS — N492 Inflammatory disorders of scrotum: Secondary | ICD-10-CM

## 2023-09-22 DIAGNOSIS — N453 Epididymo-orchitis: Secondary | ICD-10-CM

## 2023-09-22 DIAGNOSIS — Z87438 Personal history of other diseases of male genital organs: Secondary | ICD-10-CM

## 2023-09-22 NOTE — Telephone Encounter (Signed)
 Tried calling patient with no answer, left voice message for return call.

## 2023-09-22 NOTE — Progress Notes (Signed)
 Name: Eric Andrade DOB: [birth year] MRN: [id number]  Diagnoses: Post-operative state  HPI: He presents postoperatively. - GU history includes:  1. BPH with LUTS (incomplete bladder emptying). 2. Kidney stone(s). 3. Erectile dysfunction. 4. Hydrocele. Repaired surgically >5 years ago in [place].  He underwent the following procedures by Dr. Ronne Binning on 09/04/2023:  Preoperative diagnosis: Right testicular mass   Postoperative diagnosis: Same   Procedure:  1. Incision and drainage of scrotal abscess 2. Right simple orchiectomy  Pathology:  A. 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 Denies redness, warmth, swelling, or drainage at the incision site(s). Taking OTC analgesics PRN for pain with good relief. Denies fevers or acute urinary complaints.    Fall Screening: Do you usually have a device to assist in your mobility? No   Medications: Current Outpatient Medications  Medication Sig Dispense Refill   acetaminophen (TYLENOL) 650 MG CR tablet Take 1,300 mg by mouth every 8 (eight) hours as needed for pain.     albuterol (VENTOLIN HFA) 108 (90 Base) MCG/ACT inhaler Inhale 2 puffs into the lungs every 6 (six) hours as needed for wheezing or shortness of breath.     amoxicillin-clavulanate (AUGMENTIN) 875-125 MG tablet Take 1 tablet by mouth every 12 (twelve) hours. 28 tablet 0   ciprofloxacin (CIPRO) 250 MG tablet Take 1 tablet (250 mg total) by mouth 2 (two) times daily. 10 tablet 0   diclofenac (VOLTAREN) 75 MG EC tablet Take 75 mg by mouth 2 (two) times daily as needed for moderate pain (pain score 4-6).     diphenhydrAMINE (BENADRYL) 25 MG tablet Take 25 mg by mouth daily as needed for allergies (Equate Brand).     oxyCODONE-acetaminophen (PERCOCET) 5-325 MG tablet Take 1 tablet by mouth every 4  (four) hours as needed for severe pain (pain score 7-10). 30 tablet 0   senna (SENOKOT) 8.6 MG TABS tablet Take 1 tablet (8.6 mg total) by mouth daily. 30 tablet 0   sildenafil (VIAGRA) 100 MG tablet TAKE 1 TABLET BY MOUTH ONCE DAILY AS NEEDED FOR ERECTILE DYSFUNCTION 30 tablet 0   No current facility-administered medications for this visit.    Allergies: No Known Allergies  Past Medical History:  Diagnosis Date   Arthritis    BPH (benign prostatic hyperplasia)    Bronchitis    Past Surgical History:  Procedure Laterality Date   CIRCUMCISION     COLONOSCOPY WITH PROPOFOL N/A 05/30/2022   Procedure: COLONOSCOPY WITH PROPOFOL;  Surgeon: Lanelle Bal, DO;  [facility];  Service: Endoscopy;  Laterality: N/A;  7:30 am   CYSTOSCOPY N/A 06/26/2023   Procedure: CYSTOSCOPY;  Surgeon: Malen Gauze, MD;  Location: [facility];  Service: Urology;  Laterality: N/A;   HERNIA REPAIR     umbilical   HYDROCELE EXCISION Left    INCISION AND DRAINAGE ABSCESS N/A 09/04/2023   Procedure: INCISION AND DRAINAGE ABSCESS- Scrotal abcess;  Surgeon: Malen Gauze, MD;  Location: [facility];  Service: Urology;  Laterality: N/A;   LEG SURGERY Bilateral    ORCHIECTOMY Right 09/04/2023   Procedure: SIMPLE ORCHIECTOMY;  Surgeon: Malen Gauze, MD;  Location: [facility];  Service: Urology;  Laterality: Right;   POLYPECTOMY  05/30/2022   Procedure: POLYPECTOMY;  Surgeon: Lanelle Bal, DO;  [facility];  Service: Endoscopy;;  TRANSURETHRAL RESECTION OF PROSTATE N/A 06/26/2023   Procedure: TRANSURETHRAL RESECTION OF THE PROSTATE (TURP);  Surgeon: Malen Gauze, MD;  Location: [facility];  Service: Urology;  Laterality: N/A;   WRIST SURGERY Right    Family History  Problem Relation Age of Onset   Colon cancer Brother        in his late 50s/early 20s   Social History   Socioeconomic History   Marital status: Divorced    Spouse name: Not on file   Number of children: Not  on file   Years of education: Not on file   Highest education level: Not on file  Occupational History   Not on file  Tobacco Use   Smoking status: Never   Smokeless tobacco: Never  Substance and Sexual Activity   Alcohol use: Yes    Comment: rare   Drug use: Not Currently   Sexual activity: Yes  Other Topics Concern   Not on file  Social History Narrative   Not on file   Social Drivers of Health   Financial Resource Strain: Not on file  Food Insecurity: No Food Insecurity (06/26/2023)   Hunger Vital Sign    Worried About Running Out of Food in the Last Year: Never true    Ran Out of Food in the Last Year: Never true  Transportation Needs: No Transportation Needs (06/26/2023)   PRAPARE - Administrator, Civil Service (Medical): No    Lack of Transportation (Non-Medical): No  Physical Activity: Not on file  Stress: Not on file  Social Connections: Not on file  Intimate Partner Violence: Not At Risk (06/26/2023)   Humiliation, Afraid, Rape, and Kick questionnaire    Fear of Current or Ex-Partner: No    Emotionally Abused: No    Physically Abused: No    Sexually Abused: No    SUBJECTIVE  Review of Systems Constitutional: Patient denies any unintentional weight loss or change in strength lntegumentary: Patient denies any rashes or pruritus Cardiovascular: Patient denies chest pain or syncope Respiratory: Patient denies shortness of breath Gastrointestinal: Patient denies nausea, vomiting, constipation, or diarrhea Musculoskeletal: Patient denies muscle cramps or weakness Neurologic: Patient denies convulsions or seizures Allergic/Immunologic: Patient denies recent allergic reaction(s) Hematologic/Lymphatic: Patient denies bleeding tendencies Endocrine: Patient denies heat/cold intolerance  GU: As per HPI.  OBJECTIVE Vitals:   09/22/23 1113  BP: 114/74  Pulse: 87   There is no height or weight on file to calculate BMI.  Physical  Examination Constitutional: No obvious distress; patient is non-toxic appearing  Cardiovascular: No visible lower extremity edema.  Respiratory: The patient does not have audible wheezing/stridor; respirations do not appear labored  Gastrointestinal: Abdomen non-distended Musculoskeletal: Normal ROM of UEs  Skin: No obvious rashes/open sores  Neurologic: CN 2-12 grossly intact Psychiatric: Answered questions appropriately with normal affect  Hematologic/Lymphatic/Immunologic: No obvious bruises or sites of spontaneous bleeding  GU: Scrotal incision well healed, intact, with no surrounding erythema, drainage / discharge, warmth, significant tenderness to palpation. Right hemiscrotum and spermatic cord indurated with no fluctuance.  ASSESSMENT Orchitis and epididymitis  Scrotal abscess  Postop check  We reviewed the operative procedures and findings. Surgical site healing well. Pain is well controlled. We agreed to plan for follow up in 4 weeks for recheck. Advised no heavy lifting; his job involved lifting >50 pounds so he will stay out of work until follow up. Patient verbalized understanding of and agreement with current plan. All questions were answered.  PLAN Advised the following:  No follow-ups on file.  No orders of the defined types were placed in this encounter.   It has been explained that the patient is to follow regularly with their PCP in addition to all other providers involved in their care and to follow instructions provided by these respective offices. Patient advised to contact urology clinic if any urologic-pertaining questions, concerns, new symptoms or problems arise in the interim period.  There are no Patient Instructions on file for this visit.  Electronically signed by:  Donnita Falls, MSN, FNP-C, CUNP 09/22/2023 11:28 AM

## 2023-09-26 ENCOUNTER — Encounter: Payer: Self-pay | Admitting: Urology

## 2023-10-05 ENCOUNTER — Ambulatory Visit: Payer: Medicare HMO | Admitting: Urology

## 2023-10-19 NOTE — Progress Notes (Signed)
 Name: Eric Andrade DOB: [birth year] MRN: [id number]  History of Present Illness: Eric Andrade is a 68 y.o. male who presents today for follow up visit at [facility] [place].  GU History includes: 1. BPH with LUTS (incomplete bladder emptying). 2. Kidney stone(s). 3. Erectile dysfunction. 4. Hydrocele. Repaired surgically >5 years ago in [place].  Recent history:  > 09/04/2023: Underwent I&D of scrotal abscess and right simple orchiectomy by Dr. Ronne Binning. Pathology showed tissue abscesses with inflammation and infarct secondary to orchiepididymitis.  > 09/22/2023: Postop visit. Surgical site healing well. Pain well controlled. We agreed to plan for follow up in 4 weeks for recheck.   Today: He reports that his scrotal surgical site has healed well. He denies redness, warmth, tenderness, swelling, or drainage at the incision site(s). He denies fevers. He denies acute urinary complaints.  Requesting Viagra refill; states that has worked well for him with no significant bothersome side effects.  Medications: Current Outpatient Medications  Medication Sig Dispense Refill   acetaminophen (TYLENOL) 650 MG CR tablet Take 1,300 mg by mouth every 8 (eight) hours as needed for pain.     albuterol (VENTOLIN HFA) 108 (90 Base) MCG/ACT inhaler Inhale 2 puffs into the lungs every 6 (six) hours as needed for wheezing or shortness of breath.     amoxicillin-clavulanate (AUGMENTIN) 875-125 MG tablet Take 1 tablet by mouth every 12 (twelve) hours. 28 tablet 0   ciprofloxacin (CIPRO) 250 MG tablet Take 1 tablet (250 mg total) by mouth 2 (two) times daily. 10 tablet 0   diclofenac (VOLTAREN) 75 MG EC tablet Take 75 mg by mouth 2 (two) times daily as needed for moderate pain (pain score 4-6).     diphenhydrAMINE (BENADRYL) 25 MG tablet Take 25 mg by mouth daily as needed for allergies (Equate Brand).     oxyCODONE-acetaminophen (PERCOCET) 5-325 MG tablet Take 1 tablet by mouth every 4 (four) hours as  needed for severe pain (pain score 7-10). 30 tablet 0   senna (SENOKOT) 8.6 MG TABS tablet Take 1 tablet (8.6 mg total) by mouth daily. 30 tablet 0   sildenafil (VIAGRA) 100 MG tablet Take 1 tablet (100 mg total) by mouth daily as needed for erectile dysfunction. 30 tablet 2   No current facility-administered medications for this visit.    Allergies: No Known Allergies  Past Medical History:  Diagnosis Date   Arthritis    BPH (benign prostatic hyperplasia)    Bronchitis    Past Surgical History:  Procedure Laterality Date   CIRCUMCISION     COLONOSCOPY WITH PROPOFOL N/A 05/30/2022   Procedure: COLONOSCOPY WITH PROPOFOL;  Surgeon: Lanelle Bal, DO;  [facility];  Service: Endoscopy;  Laterality: N/A;  7:30 am   CYSTOSCOPY N/A 06/26/2023   Procedure: CYSTOSCOPY;  Surgeon: Malen Gauze, MD;  Location: [facility];  Service: Urology;  Laterality: N/A;   HERNIA REPAIR     umbilical   HYDROCELE EXCISION Left    INCISION AND DRAINAGE ABSCESS N/A 09/04/2023   Procedure: INCISION AND DRAINAGE ABSCESS- Scrotal abcess;  Surgeon: Malen Gauze, MD;  Location: [facility];  Service: Urology;  Laterality: N/A;   LEG SURGERY Bilateral    ORCHIECTOMY Right 09/04/2023   Procedure: SIMPLE ORCHIECTOMY;  Surgeon: Malen Gauze, MD;  Location: [facility];  Service: Urology;  Laterality: Right;   POLYPECTOMY  05/30/2022   Procedure: POLYPECTOMY;  Surgeon: Lanelle Bal, DO;  [facility];  Service: Endoscopy;;  TRANSURETHRAL RESECTION OF PROSTATE N/A 06/26/2023   Procedure: TRANSURETHRAL RESECTION OF THE PROSTATE (TURP);  Surgeon: Malen Gauze, MD;  Location: [facility];  Service: Urology;  Laterality: N/A;   WRIST SURGERY Right    Family History  Problem Relation Age of Onset   Colon cancer Brother        in his late 50s/early 92s   Social History   Socioeconomic History   Marital status: Divorced    Spouse name: Not on file   Number of children: Not on  file   Years of education: Not on file   Highest education level: Not on file  Occupational History   Not on file  Tobacco Use   Smoking status: Never   Smokeless tobacco: Never  Substance and Sexual Activity   Alcohol use: Yes    Comment: rare   Drug use: Not Currently   Sexual activity: Yes  Other Topics Concern   Not on file  Social History Narrative   Not on file   Social Drivers of Health   Financial Resource Strain: Not on file  Food Insecurity: No Food Insecurity (06/26/2023)   Hunger Vital Sign    Worried About Running Out of Food in the Last Year: Never true    Ran Out of Food in the Last Year: Never true  Transportation Needs: No Transportation Needs (06/26/2023)   PRAPARE - Administrator, Civil Service (Medical): No    Lack of Transportation (Non-Medical): No  Physical Activity: Not on file  Stress: Not on file  Social Connections: Not on file  Intimate Partner Violence: Not At Risk (06/26/2023)   Humiliation, Afraid, Rape, and Kick questionnaire    Fear of Current or Ex-Partner: No    Emotionally Abused: No    Physically Abused: No    Sexually Abused: No    Review of Systems Constitutional: Patient denies any unintentional weight loss or change in strength lntegumentary: Patient denies any rashes or pruritus Cardiovascular: Patient denies chest pain or syncope Respiratory: Patient denies shortness of breath Gastrointestinal: Denies any concerns Musculoskeletal: Patient denies muscle cramps or weakness Neurologic: Patient denies convulsions or seizures Allergic/Immunologic: Patient denies recent allergic reaction(s) Hematologic/Lymphatic: Patient denies bleeding tendencies Endocrine: Patient denies heat/cold intolerance  GU: As per HPI.  OBJECTIVE Vitals:   10/24/23 1444  BP: 134/80  Pulse: 98   There is no height or weight on file to calculate BMI.  Physical Examination Constitutional: No obvious distress; patient is non-toxic  appearing  Cardiovascular: No visible lower extremity edema.  Respiratory: The patient does not have audible wheezing/stridor; respirations do not appear labored  Gastrointestinal: Abdomen non-distended Musculoskeletal: Normal ROM of UEs  Skin: No obvious rashes/open sores  Neurologic: CN 2-12 grossly intact Psychiatric: Answered questions appropriately with normal affect  Hematologic/Lymphatic/Immunologic: No obvious bruises or sites of spontaneous bleeding  Genitourinary: Penis is normal in appearance.  Scrotum is normal in appearance. Surgical site is well healed and intact with no edema, fluctuance, erythema, warmth, rash, lesions, or tenderness to palpation.  UA: trace blood, otherwise unremarkable PVR: 0 ml  ASSESSMENT Orchitis and epididymitis - Plan: BLADDER SCAN AMB NON-IMAGING, Urinalysis, Routine w reflex microscopic  Scrotal abscess - Plan: BLADDER SCAN AMB NON-IMAGING, Urinalysis, Routine w reflex microscopic  Postop check - Plan: BLADDER SCAN AMB NON-IMAGING, Urinalysis, Routine w reflex microscopic  Benign prostatic hyperplasia, unspecified whether lower urinary tract symptoms present  H/O unilateral orchiectomy  History of hydrocele  Erectile dysfunction, unspecified erectile dysfunction type  Erectile disorder due to medical condition in male - Plan: sildenafil (VIAGRA) 100 MG tablet  He is doing well; no acute concerns. Refills sent for Viagra (Sildenafil) 100 mg PRN; we reviewed appropriate use and side effects to watch for. We agreed to plan for follow up in 6 months or sooner if needed. Patient verbalized understanding of and agreement with current plan. All questions were answered.  PLAN Advised the following: 1. Sildenafil PRN for ED. 2. Return in 11 weeks (on 01/09/2024) for f/u with Dr. Retta Diones, as previously scheduled.  Orders Placed This Encounter  Procedures   Urinalysis, Routine w reflex microscopic   BLADDER SCAN AMB NON-IMAGING    It has  been explained that the patient is to follow regularly with their PCP in addition to all other providers involved in their care and to follow instructions provided by these respective offices. Patient advised to contact urology clinic if any urologic-pertaining questions, concerns, new symptoms or problems arise in the interim period.  There are no Patient Instructions on file for this visit.  Electronically signed by:  Donnita Falls, FNP   10/24/23    3:11 PM

## 2023-10-20 ENCOUNTER — Ambulatory Visit: Payer: Medicare HMO | Admitting: Urology

## 2023-10-24 ENCOUNTER — Encounter: Payer: Self-pay | Admitting: Urology

## 2023-10-24 ENCOUNTER — Ambulatory Visit (INDEPENDENT_AMBULATORY_CARE_PROVIDER_SITE_OTHER): Admitting: Urology

## 2023-10-24 VITALS — BP 134/80 | HR 98

## 2023-10-24 DIAGNOSIS — N529 Male erectile dysfunction, unspecified: Secondary | ICD-10-CM

## 2023-10-24 DIAGNOSIS — N453 Epididymo-orchitis: Secondary | ICD-10-CM

## 2023-10-24 DIAGNOSIS — Z87438 Personal history of other diseases of male genital organs: Secondary | ICD-10-CM | POA: Diagnosis not present

## 2023-10-24 DIAGNOSIS — Z9079 Acquired absence of other genital organ(s): Secondary | ICD-10-CM

## 2023-10-24 DIAGNOSIS — N400 Enlarged prostate without lower urinary tract symptoms: Secondary | ICD-10-CM | POA: Diagnosis not present

## 2023-10-24 DIAGNOSIS — N521 Erectile dysfunction due to diseases classified elsewhere: Secondary | ICD-10-CM | POA: Diagnosis not present

## 2023-10-24 DIAGNOSIS — Z09 Encounter for follow-up examination after completed treatment for conditions other than malignant neoplasm: Secondary | ICD-10-CM | POA: Diagnosis not present

## 2023-10-24 DIAGNOSIS — N492 Inflammatory disorders of scrotum: Secondary | ICD-10-CM

## 2023-10-24 LAB — BLADDER SCAN AMB NON-IMAGING: Scan Result: 0

## 2023-10-24 MED ORDER — SILDENAFIL CITRATE 100 MG PO TABS: 100.0000 mg | ORAL_TABLET | Freq: Every day | ORAL | 2 refills | Status: DC | PRN

## 2023-10-25 LAB — URINALYSIS, ROUTINE W REFLEX MICROSCOPIC
Bilirubin, UA: NEGATIVE
Glucose, UA: NEGATIVE
Ketones, UA: NEGATIVE
Leukocytes,UA: NEGATIVE
Nitrite, UA: NEGATIVE
Protein,UA: NEGATIVE
Specific Gravity, UA: 1.025 (ref 1.005–1.030)
Urobilinogen, Ur: 0.2 mg/dL (ref 0.2–1.0)
pH, UA: 6 (ref 5.0–7.5)

## 2023-10-25 LAB — MICROSCOPIC EXAMINATION

## 2024-01-08 NOTE — Progress Notes (Incomplete)
 History of Present Illness: He is here today to follow-up his voiding trial from last week.  He is voiding quite well and happy with his success.  No gross hematuria or dysuria.  Urine has been a bit foul-smelling, however.  He was given 5 days of Keflex  last week after catheter was removed.  Past Medical History:  Diagnosis Date   Arthritis    BPH (benign prostatic hyperplasia)    Bronchitis     Past Surgical History:  Procedure Laterality Date   CIRCUMCISION     COLONOSCOPY WITH PROPOFOL  N/A 05/30/2022   Procedure: COLONOSCOPY WITH PROPOFOL ;  Surgeon: Vinetta Greening, DO;  [facility];  Service: Endoscopy;  Laterality: N/A;  7:30 am   CYSTOSCOPY N/A 06/26/2023   Procedure: CYSTOSCOPY;  Surgeon: Marco Severs, MD;  Location: [facility];  Service: Urology;  Laterality: N/A;   HERNIA REPAIR     umbilical   HYDROCELE EXCISION Left    INCISION AND DRAINAGE ABSCESS N/A 09/04/2023   Procedure: INCISION AND DRAINAGE ABSCESS- Scrotal abcess;  Surgeon: Marco Severs, MD;  Location: [facility];  Service: Urology;  Laterality: N/A;   LEG SURGERY Bilateral    ORCHIECTOMY Right 09/04/2023   Procedure: SIMPLE ORCHIECTOMY;  Surgeon: Marco Severs, MD;  Location: [facility];  Service: Urology;  Laterality: Right;   POLYPECTOMY  05/30/2022   Procedure: POLYPECTOMY;  Surgeon: Vinetta Greening, DO;  [facility];  Service: Endoscopy;;   TRANSURETHRAL RESECTION OF PROSTATE N/A 06/26/2023   Procedure: TRANSURETHRAL RESECTION OF THE PROSTATE (TURP);  Surgeon: Marco Severs, MD;  Location: [facility];  Service: Urology;  Laterality: N/A;   WRIST SURGERY Right     Home Medications:  Allergies as of 01/09/2024   No Known Allergies      Medication List        Accurate as of January 08, 2024  6:21 AM. If you have any questions, ask your nurse or doctor.          acetaminophen  650 MG CR tablet Commonly known as: TYLENOL  Take 1,300 mg by mouth every 8 (eight)  hours as needed for pain.   albuterol  108 (90 Base) MCG/ACT inhaler Commonly known as: VENTOLIN  HFA Inhale 2 puffs into the lungs every 6 (six) hours as needed for wheezing or shortness of breath.   amoxicillin -clavulanate 875-125 MG tablet Commonly known as: AUGMENTIN  Take 1 tablet by mouth every 12 (twelve) hours.   ciprofloxacin  250 MG tablet Commonly known as: Cipro  Take 1 tablet (250 mg total) by mouth 2 (two) times daily.   diclofenac 75 MG EC tablet Commonly known as: VOLTAREN Take 75 mg by mouth 2 (two) times daily as needed for moderate pain (pain score 4-6).   diphenhydrAMINE  25 MG tablet Commonly known as: BENADRYL  Take 25 mg by mouth daily as needed for allergies (Equate Brand).   oxyCODONE -acetaminophen  5-325 MG tablet Commonly known as: Percocet Take 1 tablet by mouth every 4 (four) hours as needed for severe pain (pain score 7-10).   senna 8.6 MG Tabs tablet Commonly known as: SENOKOT Take 1 tablet (8.6 mg total) by mouth daily.   sildenafil  100 MG tablet Commonly known as: VIAGRA  Take 1 tablet (100 mg total) by mouth daily as needed for erectile dysfunction.        Allergies: No Known Allergies  Family History  Problem Relation Age of Onset   Colon cancer Brother        in his  late 50s/early 76s    Social History:  reports that he has never smoked. He has never used smokeless tobacco. He reports current alcohol use. He reports that he does not currently use drugs.  ROS: A complete review of systems was performed.  All systems are negative except for pertinent findings as noted.  Physical Exam:  Vital signs in last 24 hours: There were no vitals taken for this visit. Constitutional:  Alert and oriented, No acute distress Cardiovascular: Regular rate  Respiratory: Normal respiratory effort Neurologic: Grossly intact, no focal deficits Psychiatric: Normal mood and affect  I have reviewed prior pt notes  I have reviewed urinalysis  results--looks infected     Impression/Assessment:  -BPH, status post TURP after long-term indwelling Foley catheter.  He is doing quite well.  We will culture urine  -Rt epididymo orchitis s/p Rt orchiectomy 2.10.2025 Plan:

## 2024-01-09 ENCOUNTER — Ambulatory Visit: Payer: BC Managed Care – PPO | Admitting: Urology

## 2024-01-09 DIAGNOSIS — N453 Epididymo-orchitis: Secondary | ICD-10-CM

## 2024-01-09 DIAGNOSIS — N400 Enlarged prostate without lower urinary tract symptoms: Secondary | ICD-10-CM

## 2024-01-09 DIAGNOSIS — Z09 Encounter for follow-up examination after completed treatment for conditions other than malignant neoplasm: Secondary | ICD-10-CM

## 2024-02-19 NOTE — Progress Notes (Signed)
 History of Present Illness: Eric Andrade returns for follow-up of BPH, status post TURP as well as eventual right orchiectomy for chronic, progressive epididymitis.  TURP was performed on 06/26/2023.  40 g of tissue resected, pathology all benign. Right orchiectomy performed on 09/04/2023.  Currently, he is voiding quite well.  He has no gross hematuria or dysuria.  IPSS 0/0.  He is having no right scrotal pain.  Past Medical History:  Diagnosis Date   Arthritis    BPH (benign prostatic hyperplasia)    Bronchitis     Past Surgical History:  Procedure Laterality Date   CIRCUMCISION     COLONOSCOPY WITH PROPOFOL  N/A 05/30/2022   Procedure: COLONOSCOPY WITH PROPOFOL ;  Surgeon: Cindie Carlin POUR, DO;  [facility];  Service: Endoscopy;  Laterality: N/A;  7:30 am   CYSTOSCOPY N/A 06/26/2023   Procedure: CYSTOSCOPY;  Surgeon: Sherrilee Belvie CROME, MD;  Location: [facility];  Service: Urology;  Laterality: N/A;   HERNIA REPAIR     umbilical   HYDROCELE EXCISION Left    INCISION AND DRAINAGE ABSCESS N/A 09/04/2023   Procedure: INCISION AND DRAINAGE ABSCESS- Scrotal abcess;  Surgeon: Sherrilee Belvie CROME, MD;  Location: [facility];  Service: Urology;  Laterality: N/A;   LEG SURGERY Bilateral    ORCHIECTOMY Right 09/04/2023   Procedure: SIMPLE ORCHIECTOMY;  Surgeon: Sherrilee Belvie CROME, MD;  Location: [facility];  Service: Urology;  Laterality: Right;   POLYPECTOMY  05/30/2022   Procedure: POLYPECTOMY;  Surgeon: Cindie Carlin POUR, DO;  [facility];  Service: Endoscopy;;   TRANSURETHRAL RESECTION OF PROSTATE N/A 06/26/2023   Procedure: TRANSURETHRAL RESECTION OF THE PROSTATE (TURP);  Surgeon: Sherrilee Belvie CROME, MD;  Location: [facility];  Service: Urology;  Laterality: N/A;   WRIST SURGERY Right     Home Medications:  Allergies as of 02/20/2024   No Known Allergies      Medication List        Accurate as of February 19, 2024 12:37 PM. If you have any questions, ask your nurse or doctor.           acetaminophen  650 MG CR tablet Commonly known as: TYLENOL  Take 1,300 mg by mouth every 8 (eight) hours as needed for pain.   albuterol  108 (90 Base) MCG/ACT inhaler Commonly known as: VENTOLIN  HFA Inhale 2 puffs into the lungs every 6 (six) hours as needed for wheezing or shortness of breath.   amoxicillin -clavulanate 875-125 MG tablet Commonly known as: AUGMENTIN  Take 1 tablet by mouth every 12 (twelve) hours.   ciprofloxacin  250 MG tablet Commonly known as: Cipro  Take 1 tablet (250 mg total) by mouth 2 (two) times daily.   diclofenac 75 MG EC tablet Commonly known as: VOLTAREN Take 75 mg by mouth 2 (two) times daily as needed for moderate pain (pain score 4-6).   diphenhydrAMINE  25 MG tablet Commonly known as: BENADRYL  Take 25 mg by mouth daily as needed for allergies (Equate Brand).   oxyCODONE -acetaminophen  5-325 MG tablet Commonly known as: Percocet Take 1 tablet by mouth every 4 (four) hours as needed for severe pain (pain score 7-10).   senna 8.6 MG Tabs tablet Commonly known as: SENOKOT Take 1 tablet (8.6 mg total) by mouth daily.   sildenafil  100 MG tablet Commonly known as: VIAGRA  Take 1 tablet (100 mg total) by mouth daily as needed for erectile dysfunction.        Allergies: No Known Allergies  Family History  Problem Relation Age of Onset  Colon cancer Brother        in his late 50s/early 48s    Social History:  reports that he has never smoked. He has never used smokeless tobacco. He reports current alcohol use. He reports that he does not currently use drugs.  ROS: A complete review of systems was performed.  All systems are negative except for pertinent findings as noted.  Physical Exam:  Vital signs in last 24 hours: There were no vitals taken for this visit. Constitutional:  Alert and oriented, No acute distress Cardiovascular: Regular rate  Respiratory: Normal respiratory effor Genitourinary: Phallus circumcised.  Right  testicle absent, left testicle and epididymal structures are normal.  No inguinal hernias. Lymphatic: No lymphadenopathy Neurologic: Grossly intact, no focal deficits Psychiatric: Normal mood and affect  I have reviewed prior pt notes  I have reviewed urinalysis results--clear today  I have reviewed prior PSA and pathology results  I have reviewed prior urine cultures  Residual urine volume today 0  IPSS sheet reviewed Impression/Assessment:  -BPH with history of long-term catheter management, status post TURP in December, 2024.  Pathology benign.  Emptying out well today, satisfied with symptomatic results  -Right epididymal orchitis, status post right orchiectomy [facility] 2025, doing well  Plan:  -At this point, as he is doing well, emptying well, symptomatically improved, I will have him come back as needed

## 2024-02-20 ENCOUNTER — Ambulatory Visit (INDEPENDENT_AMBULATORY_CARE_PROVIDER_SITE_OTHER): Payer: Medicare (Managed Care) | Admitting: Urology

## 2024-02-20 VITALS — BP 143/84 | HR 75

## 2024-02-20 DIAGNOSIS — N400 Enlarged prostate without lower urinary tract symptoms: Secondary | ICD-10-CM | POA: Diagnosis not present

## 2024-02-20 DIAGNOSIS — N521 Erectile dysfunction due to diseases classified elsewhere: Secondary | ICD-10-CM

## 2024-02-20 LAB — MICROSCOPIC EXAMINATION: WBC, UA: NONE SEEN /HPF (ref 0–5)

## 2024-02-20 LAB — URINALYSIS, ROUTINE W REFLEX MICROSCOPIC
Bilirubin, UA: NEGATIVE
Glucose, UA: NEGATIVE
Ketones, UA: NEGATIVE
Leukocytes,UA: NEGATIVE
Nitrite, UA: NEGATIVE
Specific Gravity, UA: 1.02 (ref 1.005–1.030)
Urobilinogen, Ur: 0.2 mg/dL (ref 0.2–1.0)
pH, UA: 6 (ref 5.0–7.5)

## 2024-02-20 MED ORDER — SILDENAFIL CITRATE 100 MG PO TABS: 100.0000 mg | ORAL_TABLET | Freq: Every day | ORAL | 2 refills | Status: DC | PRN

## 2024-02-20 NOTE — Progress Notes (Signed)
 Bladder Scan completed today.  Patient can void prior to the bladder scan. Bladder scan result: 0  Performed By: Gwendolyn Grant T. CMA  Additional notes-

## 2024-06-02 ENCOUNTER — Other Ambulatory Visit: Payer: Self-pay | Admitting: Urology

## 2024-06-02 DIAGNOSIS — N521 Erectile dysfunction due to diseases classified elsewhere: Secondary | ICD-10-CM

## 2024-06-04 DIAGNOSIS — E0590 Thyrotoxicosis, unspecified without thyrotoxic crisis or storm: Secondary | ICD-10-CM | POA: Diagnosis not present

## 2024-06-04 DIAGNOSIS — Z299 Encounter for prophylactic measures, unspecified: Secondary | ICD-10-CM | POA: Diagnosis not present

## 2024-06-04 DIAGNOSIS — Z125 Encounter for screening for malignant neoplasm of prostate: Secondary | ICD-10-CM | POA: Diagnosis not present

## 2024-06-04 DIAGNOSIS — Z7189 Other specified counseling: Secondary | ICD-10-CM | POA: Diagnosis not present

## 2024-06-04 DIAGNOSIS — Z1331 Encounter for screening for depression: Secondary | ICD-10-CM | POA: Diagnosis not present

## 2024-06-04 DIAGNOSIS — Z1339 Encounter for screening examination for other mental health and behavioral disorders: Secondary | ICD-10-CM | POA: Diagnosis not present

## 2024-06-04 DIAGNOSIS — Z23 Encounter for immunization: Secondary | ICD-10-CM | POA: Diagnosis not present

## 2024-06-04 DIAGNOSIS — E7800 Pure hypercholesterolemia, unspecified: Secondary | ICD-10-CM | POA: Diagnosis not present

## 2024-06-04 DIAGNOSIS — Z0000 Encounter for general adult medical examination without abnormal findings: Secondary | ICD-10-CM | POA: Diagnosis not present

## 2024-06-04 DIAGNOSIS — Z79899 Other long term (current) drug therapy: Secondary | ICD-10-CM | POA: Diagnosis not present

## 2024-06-04 DIAGNOSIS — R52 Pain, unspecified: Secondary | ICD-10-CM | POA: Diagnosis not present
